# Patient Record
Sex: Male | Born: 1955 | Race: Black or African American | Hispanic: No | Marital: Single | Smoking: Current every day smoker
Health system: Southern US, Community
[De-identification: ages and names within clinical notes are randomized; demographics above are authoritative.]

## PROBLEM LIST (undated history)

## (undated) DIAGNOSIS — F1910 Other psychoactive substance abuse, uncomplicated: Secondary | ICD-10-CM

## (undated) DIAGNOSIS — M5430 Sciatica, unspecified side: Secondary | ICD-10-CM

## (undated) DIAGNOSIS — K08109 Complete loss of teeth, unspecified cause, unspecified class: Secondary | ICD-10-CM

## (undated) DIAGNOSIS — M1990 Unspecified osteoarthritis, unspecified site: Secondary | ICD-10-CM

## (undated) DIAGNOSIS — H509 Unspecified strabismus: Secondary | ICD-10-CM

## (undated) DIAGNOSIS — Z860100 Personal history of colon polyps, unspecified: Secondary | ICD-10-CM

## (undated) DIAGNOSIS — D573 Sickle-cell trait: Secondary | ICD-10-CM

## (undated) DIAGNOSIS — M549 Dorsalgia, unspecified: Secondary | ICD-10-CM

## (undated) DIAGNOSIS — K4020 Bilateral inguinal hernia, without obstruction or gangrene, not specified as recurrent: Secondary | ICD-10-CM

## (undated) DIAGNOSIS — F329 Major depressive disorder, single episode, unspecified: Secondary | ICD-10-CM

## (undated) DIAGNOSIS — G8929 Other chronic pain: Secondary | ICD-10-CM

## (undated) DIAGNOSIS — F32A Depression, unspecified: Secondary | ICD-10-CM

## (undated) DIAGNOSIS — Z972 Presence of dental prosthetic device (complete) (partial): Secondary | ICD-10-CM

## (undated) DIAGNOSIS — Z87828 Personal history of other (healed) physical injury and trauma: Secondary | ICD-10-CM

## (undated) DIAGNOSIS — S0990XA Unspecified injury of head, initial encounter: Secondary | ICD-10-CM

## (undated) HISTORY — DX: Other psychoactive substance abuse, uncomplicated: F19.10

## (undated) HISTORY — DX: Major depressive disorder, single episode, unspecified: F32.9

## (undated) HISTORY — PX: OTHER SURGICAL HISTORY: SHX169

## (undated) HISTORY — DX: Depression, unspecified: F32.A

## (undated) HISTORY — DX: Unspecified injury of head, initial encounter: S09.90XA

## (undated) HISTORY — DX: Dorsalgia, unspecified: M54.9

## (undated) HISTORY — DX: Sickle-cell trait: D57.3

## (undated) HISTORY — DX: Other chronic pain: G89.29

## (undated) HISTORY — DX: Unspecified osteoarthritis, unspecified site: M19.90

---
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
## 2001-07-06 ENCOUNTER — Emergency Department (HOSPITAL_COMMUNITY): Admission: EM | Admit: 2001-07-06 | Discharge: 2001-07-06 | Payer: Self-pay | Admitting: Emergency Medicine

## 2003-07-05 ENCOUNTER — Emergency Department (HOSPITAL_COMMUNITY): Admission: EM | Admit: 2003-07-05 | Discharge: 2003-07-06 | Payer: Self-pay | Admitting: Emergency Medicine

## 2004-02-10 ENCOUNTER — Emergency Department (HOSPITAL_COMMUNITY): Admission: EM | Admit: 2004-02-10 | Discharge: 2004-02-10 | Payer: Self-pay | Admitting: Emergency Medicine

## 2005-01-03 ENCOUNTER — Emergency Department (HOSPITAL_COMMUNITY): Admission: EM | Admit: 2005-01-03 | Discharge: 2005-01-03 | Payer: Self-pay | Admitting: Emergency Medicine

## 2005-01-06 ENCOUNTER — Ambulatory Visit: Payer: Self-pay | Admitting: *Deleted

## 2005-01-06 ENCOUNTER — Ambulatory Visit: Payer: Self-pay | Admitting: Family Medicine

## 2005-01-26 ENCOUNTER — Emergency Department (HOSPITAL_COMMUNITY): Admission: EM | Admit: 2005-01-26 | Discharge: 2005-01-26 | Payer: Self-pay | Admitting: Emergency Medicine

## 2006-09-08 ENCOUNTER — Inpatient Hospital Stay (HOSPITAL_COMMUNITY): Admission: EM | Admit: 2006-09-08 | Discharge: 2006-09-20 | Payer: Self-pay | Admitting: Emergency Medicine

## 2006-10-02 ENCOUNTER — Ambulatory Visit: Payer: Self-pay | Admitting: Internal Medicine

## 2007-05-16 ENCOUNTER — Ambulatory Visit: Payer: Self-pay | Admitting: Internal Medicine

## 2007-06-11 ENCOUNTER — Ambulatory Visit: Payer: Self-pay | Admitting: Internal Medicine

## 2007-07-24 ENCOUNTER — Ambulatory Visit: Payer: Self-pay | Admitting: Internal Medicine

## 2007-07-24 LAB — CONVERTED CEMR LAB
Albumin: 4.8 g/dL (ref 3.5–5.2)
CO2: 26 meq/L (ref 19–32)
Calcium: 9.9 mg/dL (ref 8.4–10.5)
Chloride: 102 meq/L (ref 96–112)
Cholesterol: 141 mg/dL (ref 0–200)
Eosinophils Relative: 3 % (ref 0–5)
Glucose, Bld: 80 mg/dL (ref 70–99)
HCT: 40.9 % (ref 39.0–52.0)
Lymphocytes Relative: 33 % (ref 12–46)
Lymphs Abs: 2.8 10*3/uL (ref 0.7–4.0)
Neutro Abs: 4.6 10*3/uL (ref 1.7–7.7)
Neutrophils Relative %: 54 % (ref 43–77)
Platelets: 203 10*3/uL (ref 150–400)
Potassium: 4.1 meq/L (ref 3.5–5.3)
Sodium: 141 meq/L (ref 135–145)
Total Protein: 7.6 g/dL (ref 6.0–8.3)
Triglycerides: 109 mg/dL (ref <150)
WBC: 8.5 10*3/uL (ref 4.0–10.5)

## 2007-09-17 ENCOUNTER — Ambulatory Visit: Payer: Self-pay | Admitting: Internal Medicine

## 2008-01-08 ENCOUNTER — Ambulatory Visit: Payer: Self-pay | Admitting: Internal Medicine

## 2008-03-03 ENCOUNTER — Encounter: Admission: RE | Admit: 2008-03-03 | Discharge: 2008-04-01 | Payer: Self-pay | Admitting: Orthopedic Surgery

## 2008-04-02 ENCOUNTER — Ambulatory Visit: Payer: Self-pay | Admitting: Internal Medicine

## 2008-04-05 ENCOUNTER — Ambulatory Visit (HOSPITAL_COMMUNITY): Admission: RE | Admit: 2008-04-05 | Discharge: 2008-04-05 | Payer: Self-pay | Admitting: Internal Medicine

## 2008-04-16 ENCOUNTER — Encounter: Admission: RE | Admit: 2008-04-16 | Discharge: 2008-04-16 | Payer: Self-pay | Admitting: Orthopedic Surgery

## 2008-05-01 HISTORY — PX: PATELLA FRACTURE SURGERY: SHX735

## 2008-05-01 HISTORY — PX: HIP FRACTURE SURGERY: SHX118

## 2008-05-27 ENCOUNTER — Ambulatory Visit: Payer: Self-pay | Admitting: Internal Medicine

## 2008-06-10 ENCOUNTER — Encounter: Admission: RE | Admit: 2008-06-10 | Discharge: 2008-06-10 | Payer: Self-pay | Admitting: Orthopaedic Surgery

## 2008-07-15 ENCOUNTER — Ambulatory Visit: Payer: Self-pay | Admitting: Internal Medicine

## 2008-07-15 LAB — CONVERTED CEMR LAB
Albumin: 4.9 g/dL (ref 3.5–5.2)
Alkaline Phosphatase: 90 units/L (ref 39–117)
Anti Nuclear Antibody(ANA): NEGATIVE
BUN: 12 mg/dL (ref 6–23)
Basophils Absolute: 0 10*3/uL (ref 0.0–0.1)
Basophils Relative: 0 % (ref 0–1)
CO2: 24 meq/L (ref 19–32)
Eosinophils Relative: 1 % (ref 0–5)
Glucose, Bld: 93 mg/dL (ref 70–99)
HCT: 43 % (ref 39.0–52.0)
Hemoglobin: 15.8 g/dL (ref 13.0–17.0)
Lymphocytes Relative: 21 % (ref 12–46)
MCHC: 36.7 g/dL — ABNORMAL HIGH (ref 30.0–36.0)
Monocytes Absolute: 0.8 10*3/uL (ref 0.1–1.0)
Monocytes Relative: 7 % (ref 3–12)
PSA: 0.27 ng/mL (ref 0.10–4.00)
Potassium: 4.2 meq/L (ref 3.5–5.3)
RBC: 4.88 M/uL (ref 4.22–5.81)
RDW: 15 % (ref 11.5–15.5)
Total Protein: 7.5 g/dL (ref 6.0–8.3)

## 2009-02-25 ENCOUNTER — Ambulatory Visit: Payer: Self-pay | Admitting: Internal Medicine

## 2009-04-02 ENCOUNTER — Encounter: Admission: RE | Admit: 2009-04-02 | Discharge: 2009-04-02 | Payer: Self-pay | Admitting: Neurological Surgery

## 2009-05-03 ENCOUNTER — Ambulatory Visit: Payer: Self-pay | Admitting: Internal Medicine

## 2009-05-03 LAB — CONVERTED CEMR LAB: PSA: 0.32 ng/mL (ref 0.10–4.00)

## 2009-07-28 ENCOUNTER — Emergency Department (HOSPITAL_COMMUNITY): Admission: EM | Admit: 2009-07-28 | Discharge: 2009-07-28 | Payer: Self-pay | Admitting: Emergency Medicine

## 2009-08-11 ENCOUNTER — Emergency Department (HOSPITAL_COMMUNITY): Admission: EM | Admit: 2009-08-11 | Discharge: 2009-08-11 | Payer: Self-pay | Admitting: Emergency Medicine

## 2010-09-13 NOTE — H&P (Signed)
NAME:  ESTILL, LLERENA NO.:  [id number]   MEDICAL RECORD NO.:  [id number]          PATIENT TYPE:  EMS   LOCATION:  MAJO                         FACILITY:  [facility]   PHYSICIAN:  Sandria Bales. Ezzard Standing, M.D.  DATE OF BIRTH:  March 19, 1956   DATE OF ADMISSION:  09/07/2006  DATE OF DISCHARGE:                              HISTORY & PHYSICAL   HISTORY:  Mr. Coppolino is a 55 year old black male who currently goes  to [facility] for his primary medical care.  His history is obtained  almost entirely from his family; his brother, sister and mother who are  at bedside.  The patient himself really is almost noncommunicative.   According to his presentation, he had climbed a ladder about 20 feet and  fell; while, we think, cutting branches.  There is a question whether he  had some type of drug abuse, it seems at which he first admitted too --  but now really is noncommunicative.   PAST MEDICAL HISTORY:   ALLERGIES:  NONE.   MEDICATIONS:  None.   REVIEW OF SYSTEMS:  NEUROLOGIC:  No partial seizure or loss of  consciousness.  PULMONARY:  He smokes cigarettes, but no heart disease or chest pain.  GASTROINTESTINAL:  No history of liver disease.  UROLOGIC:  No kidney stones or kidney infections.   Again, he is apparently seen through [facility] from his primary care  standpoint.  Again, he is on now medicines and has no chronic medical  issues.  But, he lives by himself and his family is really not aware of  his background.  It sounds like he may be part time homeless.   PHYSICAL EXAMINATION:  VITAL SIGNS:  Blood pressure 142/89, pulse 98,  respirations 20.  HEENT:  Shows multiple small scalp lacerations along his left anterior  scalp, above his left eye.  He has ecchymosis of his left eye.  His  extraocular movement is X6, he has a little bit of disconjugate gaze;  but, he actually seems to track or follow a finger pretty well.  His  pupils are about 3-4 mm, minimally  reactive but symmetric.   His dental dentition is terrible.  He has missing teeth and has caries  involving the remaining teeth, which all appear to be more chronic.  I  do not see any obvious acute oral injury.  NECK:  In a collar, but he has been moving and twisting his neck all  over the place without any obvious pain. We will leave him in the collar  until we can clear his neck.  LUNGS:  Clear to auscultation with symmetric breath sounds.  HEART:  Regular rate and rhythm without murmur or rub.  He has palpable  femoral pulses.  ABDOMEN:  Soft.  He has no tenderness, no guarding, no mass.  EXTREMITIES:  He has his left  hip flexed and internally rotated --  raising a question whether he has a left hip dislocation.  He has  pulses, both dorsalis pedis and posterior tibialis pulses in both feet -  - they appear to be symmetric.  He does react to pain  in his foot; also  he has sensation and motor function.  He is moving all extremities  grossly, though not really appropriately.  He has a 1.5 cm lac over his  right knee I will clean and close.   LABORATORY STUDIES:  Hemoglobin 14, hematocrit 42, white blood count  13,700, platelets 252.  Sodium 144, potassium 3.8, chloride 106, CO2 30,  glucose 103, BUN 18, creatinine 1.2.   I reviewed his films with Dr. Loraine Leriche Bulls.  First, a CT of the head shows  no obvious intracranial mass effect.  He does have a left zygomatic  fracture, a fracture to his  left orbit that extends  to the base of his  skull, with a little bit of air in his skull.   We do not have C-spine films.  He was not cooperative.  We tried 3  separate times to get him to do a CAT scan, without him cooperating  enough.   His chest x-ray shows a thickening of his left pleural space, but no  obvious pneumothorax or effusion.   His pelvis films appear to show a left acetabular fracture, though not  truly dislocated (at least on the lateral films).   IMPRESSION:  1. Closed  head injury from traumatic fall.  Plan observation.  2. Left zygomatic orbit and base of skull fracture.  The patient is      certainly at risk and is being admitted.  Will leave on antibiotics      for 48 hours.  But the fractures are really not much displaced, and      do not see any reason in getting maxillofacial involved.  3. Very poor dentition.  4. C-spine film pending at time of this dictation.  Due to movement.      We will try to repeat this in the morning.  Will leave in collar.  5. Left pleural thickening.  Etiology unclear.  6. Probable Left acetabular fracture with possible dislocation.  I      spoke with Dr. Alvy Beal, who will see the patient in      consultation.  7. A questionable drug abuse.  Will give vitamins and thiamine, and      see how he does from a sedation standpoint.      Sandria Bales. Ezzard Standing, M.D.  Electronically Signed     DHN/MEDQ  D:  09/07/2006  T:  09/07/2006  Job:  [id number]

## 2010-09-13 NOTE — Consult Note (Signed)
NAME:  Shawn Newton, Shawn Newton NO.:  [id number]   MEDICAL RECORD NO.:  [id number]          PATIENT TYPE:  INP   LOCATION:  3104                         FACILITY:  [facility]   PHYSICIAN:  Alvy Beal, MD    DATE OF BIRTH:  [birth year]   DATE OF CONSULTATION:  09/08/2006  DATE OF DISCHARGE:                                 CONSULTATION   REASON FOR CONSULTATION:  Status post fall, with question new versus old  left acetabular fracture.   HISTORY:  I was contacted by Dr. Ezzard Standing on Sep 07, 2006 at approximately  10:30, concerning this patient.  He was a 54 year old gentleman with a  history of substance abuse, according to his family who presented after  a fall.  He fell from a ladder and was diagnosed with a left frontal  skull fracture, which radiated into the base of the skull, left orbit  roof and lateral wall fracture, left zygomatic fracture, left mandible  fracture.  Right mandibular condyle fracture.  He also tested positive  for cocaine and benzodiazepines, and he had a left patellar fracture,  questionable versus new.  The patient was somewhat combative and the x-  rays of his hip were very poor, but there was some evidence of a  congenital or chronic deformity with a possible new acute fracture  component to it.  At that point in time, attempts at obtaining a CT scan  were made but the patient was just too combative and would not sit still  for the CT scan.  I was contacted concerning his exam and I indicated  that since he was neurologically intact, and there was no evidence of a  dislocation, that he could be admitted to the trauma service and we  could get the pelvic/hip CT in the morning.  At this point in time,  attempts were made to get the CT scan today, but he was still somewhat  combative, but he had recently been sedated and so he is in the process  of going for his CT scan.   PHYSICAL EXAMINATION:  NEUROLOGIC: Currently, the patient is sedated.  He is  in the neuro-intensive care unit.  He is hemodynamically intact.  He is grossly neurologically intact, moving all 4 extremities and  withdrawing to pain.  He has intact  peripheral pulses throughout.  The  left lower extremity is slightly internally rotated.  But it is not  short, relative to the contralateral extremity.  He has 2 staples over  the right knee, which were removed.  There was a small superficial  laceration noted.  I did probe it at the bedside.  It did not  communicate down to the tib bone.  There was no excessive fracture  bleeding noted.  It did not appear to communicate beyond the superficial  fascia.   At this point in time, because the patient is sedated, I could not get  an accurate neurologic exam.  His x-rays of the knee, which were ordered  today because of his complaints of knee demonstrated a minimally  displaced patella fracture of the right side.  He  is able to grossly  flex the knee and extend the knee and extend the knee and straight-leg  raise the extremity.  However, because of the patellar fracture.  I did  place him on a knee immobilizer.   At this point in time, I am somewhat concerned about his left acetabulum  and so since he is sedated, I have contacted CT and they will take him  now for his CT scan of the pelvis and left hip.  Furthermore, in terms  of the right patellar, given his history of substance abuse and his  questionable ability to be compliant with treatment options, I am going  to convert from the knee immobilizer to a long-leg cast on the right  side.  Keeping the cast in full extension will prevent further  displacement of the patellar fracture.   I am also going to contact my partner, Dr. Ranell Patrick to evaluate the x-rays  to determine whether or not he feels as though surgical fixation is  warranted.  If he does, then once the patient is cleared from a trauma  standpoint, will plan on performing an open reduction internal fixation  of  the right patella.  However, at this point in time, given his  significant skull fractures and trauma, and the positive cocaine, I do  not think at this point in time it is prudent to take him emergently to  the OR.   Will continue to monitor him and further recommendation pending the  review of the CT scan of his left pelvis.      Alvy Beal, MD  Electronically Signed     DDB/MEDQ  D:  09/08/2006  T:  09/08/2006  Job:  [id number]

## 2010-09-13 NOTE — Op Note (Signed)
NAME:  Shawn Newton, Shawn Newton NO.:  [id number]   MEDICAL RECORD NO.:  [id number]          PATIENT TYPE:  INP   LOCATION:  3104                         FACILITY:  [facility]   PHYSICIAN:  Alvy Beal, MD    DATE OF BIRTH:  12-22-1955   DATE OF PROCEDURE:  DATE OF DISCHARGE:                               OPERATIVE REPORT   PREOPERATIVE DIAGNOSES:  1. Right patella fracture, question open.  2. Left fracture dislocation of the hip acetabular fracture posterior-      superior wall with hip dislocation.   POSTOPERATIVE DIAGNOSES:  1. Left hip dislocation with acetabular fracture.  2. Open right patella fracture.   OPERATIVE PROCEDURE:  1. Closed reduction of left hip dislocation and insertion of proximal      tibial traction pin.  2. Irrigation and debridement of right patella fracture.  3. Open reduction and internal fixation of right patella fracture.   FIRST ASSISTANT:  Almedia Balls. Ranell Patrick, M.D.   HISTORY:  This is a very pleasant 55 year old gentleman who presented to  the trauma service on Sep 07, 2006 status post a fall from a ladder.  He  was initially diagnosed with a left hip deformity, question acuity.  A  CT scan done this evening demonstrated it was an acute acetabular  fracture with a dislocation.  At this point in time because of the  dislocation, we elected to take him to the operating room for closed  reduction and insertion of a traction pin.  In addition, because of the  questionable wound, although it was initially felt to be closed, we also  consented him for an I&D and injection to confirm that it was in fact  closed.  All appropriate risks, benefits and alternatives were explained  to the patient's mother as he could not sign consent.  Consent was  obtained and the patient was taken to the operating room.   INTRAOPERATIVE FINDINGS:  Open right patella fracture.  This was  confirmed with methylene blue injection into the joint.  That was  irrigated  and debrided and reduced and an open reduction using a  cannulated Synthes 45 screws with a tension time.   COMPLICATIONS:  None.   TOURNIQUET TIME:  44 minutes   On the left side, closed reduction was confirmed with intraoperative  fluoro views.  Proximal tibial traction pin placement was confirmed with  C arm and 10 pounds of traction was applied.   OPERATIVE NOTE:  The patient was brought into the operating room and  placed supine on the operating room table.  After successful induction  of general anesthesia and endotracheal intubation, both lower  extremities were prepped and draped in a standard fashion.  Dr. Ranell Patrick  and I then performed a gentle closed reduction maneuver and restored the  length and rotation back to the lower extremity.  We confirmed reduction  of the hip with AP and lateral views on the fluoro, which were saved.  We then placed a proximal tibial traction pin starting on the lateral  aspect through an incision proceeding through the proximal tibia and  then exiting through  a separate stab incision.  We confirmed position of  the traction pin in the AP and lateral planes with fluoro.  We then  applied 10 pounds of traction in order to maintain the reduction of the  hip dislocation.   At this point in time, I then injected about 20 mL of methylene blue  into the right knee.  The methylene blue did escape through the wound  and at this point, we confirmed there was truly an open patella  fracture.   At that point, we then reprepped and draped the right lower extremity  and then proceeded with ORIF.   A tourniquet was applied.  The leg was elevated and exsanguinated with  an Esmarch and the tourniquet was inflated to 300 mmHg.  At this point  in time, a midline incision was made over the patella and we excised the  small superficial laceration.  We then removed the portion of the bursa  as well and debrided the area.  At this point, we then incised the   fascia over the fracture site and exposed the fracture site itself.  We  then evacuated the hematoma, curetted the bony edges and then irrigated  the joint with 3 liters of lactated Ringer's with pulsatile lavage.  Once we cleaned out the wound, I then checked the posterior compartments  of the calf to ensure there was no extravasation of fluid and no undue  pressure.  The compartments were noted to be soft.   At this point in time, we then proceeded with the ORIF.  A guide pin was  then advanced through the inferior aspect of the patella across the  fracture site and into the superior portion of the patella.  A second  guide pin was advanced slightly perpendicular to the fracture and just a  little bit convergent to the first pin.  We then confirmed that the pins  were not intraarticular.  Once confirmed we then measured and then  drilled with the 2.7 drill.  We then advanced two cannulated screws over  the guide pain at all times maintaining the reduction.  We had excellent  purchase of the screws and we were able to maintain the reduction.  We  then repeated the AP and lateral films to confirm that the screws were  not intraarticular and that we had satisfactory reduction.  Once we  confirmed that the reduction was anatomic and the screws were in proper  position in both the AP and lateral planes, we then passed a wire from  inferior to superior through the cannulated screw and then wrapped it  over the patella and then back through the other screw.  At this point,  we then criss-crossed them over the patella and then under appropriate  torque, tension applied with the tension band.  We then buried the wire  in the quadriceps tendon and cut the edges.  We were then able to gently  flex the knee and there was no gapping of the patella fracture.  We then  took final x-rays which were satisfactory in both the AP and lateral planes.  We then irrigated the wound copiously with normal  saline  pulsatile lavage and then closed the superficial tissue with 2-0 Vicryl  sutures, the skin with staples, a dry bulky dressing was applied and an  Ace wrap.  The tourniquet was deflated and a knee immobilizer was  applied.  The patient was then transferred onto a traction bed where the  10 pounds was reapplied and then he was transferred to the PACU without  incident.  At the end of the case, all needle and sponge counts were  correct.      Alvy Beal, MD  Electronically Signed     DDB/MEDQ  D:  09/08/2006  T:  09/09/2006  Job:  [id number]

## 2010-09-13 NOTE — Consult Note (Signed)
NAME:  Shawn Newton, Shawn Newton NO.:  [id number]   MEDICAL RECORD NO.:  [id number]          PATIENT TYPE:  INP   LOCATION:  3104                         FACILITY:  [facility]   PHYSICIAN:  Doralee Albino. Carola Frost, M.D. DATE OF BIRTH:  02/15/1956   DATE OF CONSULTATION:  09/10/2006  DATE OF DISCHARGE:                                 CONSULTATION   REASON FOR CONSULTATION:  Left acetabular fracture, dislocation status  post reduction.   BRIEF HISTORY PRESENTATION:  The patient is a 56 year old male,  polysubstance abuser, who fell approximately 20 feet sustaining injury  to his left hip and right knee.  He was seen and evaluated in the  emergency department by the trauma service, who noted some internal  rotation of his left hip.  He was combative, unable to cooperate with  the examination.  He was subsequently seen and evaluated by Dr. Shon Baton,  who then was then was successful in being able to obtain a CT scan of  the pelvis.  He had previous attempts times 3 at a CT scan but because  of his combativeness was unable really to undergo the scan, per report.  He also was noted to have a small laceration of the right knee, which  apparently was washed out and closed in the ED.  CT scan did demonstrate  a fracture dislocation of the left hip.  The patient was then taken to  the operating room where he underwent closed reduction and placement of  a traction pin as well as I and D and fixation of the right patella  fracture after it was found to be open by intra-articular injection.   I was subsequently asked to see the patient regarding definitive  management of his left acetabular fracture.  The interval to relocation  was approximately 24 hours from injury.   PHYSICAL EXAMINATION:  GENERAL:  On examination today the patient  remains very difficult to examine.  He is intermittently answering  questions, but does not seem to be coherent.  He is demonstrating a  spontaneous motion of all  4 extremities and is capable of some limited  cooperation with the examination.   PAST MEDICAL HISTORY:  Prior jaw fractures.  Negative for coronary  diabetes, hypertension or COPD.   SOCIAL HISTORY:  The patient does smoke tobacco.  Has a history of  marijuana use.   ALLERGIES:  NONE.   PRIMARY CARE PHYSICIAN:  [facility].   PHYSICAL EXAMINATION:  PSYCHIATRIC:  The patient is in restraints.  EXTREMITIES:  His leg lengths appear equal.  His hip rotation is  symmetric.  He is unable to tell me whether he has intact sensation over  the foot, but he can demonstrate active deep peroneal, superficial  peroneal and tibial nerve motor function.  Posterior tib pulse 2+ and  dorsalis pedis pulses not readily palpable.  No focal crepitus or  tenderness about the ankle or left knee.  No focal tenderness or  crepitus about the left wrist, elbow and shoulder, similarly right  wrist, elbow, shoulder without focal crepitus, ecchymosis, blocked  motion or diminished strength.  Also of  note, the right knee is in a  knee immobilizer and dressing.  This was not removed.  GENERAL:  In general he appears moderately well-nourished and  appropriate for age.  HEENT:  He does have poor dentition.   IMAGING:  X-rays that demonstrate a large posterior wall fracture  dislocation on the left which is status post reduction now.  C-arm  images only are available following reduction.  Also visualized are the  AP and lateral of patellar films of both pre and post ORIF with  excellent reduction.   ASSESSMENT:  1. Left acetabular fracture, dislocation with prolonged interval to      reduction.  2. Polysubstance abuse.   </   ASSESSMENT/PLAN:  The patient's post reduction CT scan as well as plain  films, the need for definitive internal fixation given the large  fragment, clearly the patient is at increased risk of complications  including avascular necrosis, arthritis, heterotopic ossification, in   addition to deep venous thrombosis , pulmonary embolism, and others.  I  will discuss these findings with trauma service and be happy to assume  care of the acetabular injury.      Doralee Albino. Carola Frost, M.D.  Electronically Signed     MHH/MEDQ  D:  09/10/2006  T:  09/10/2006  Job:  [id number]

## 2010-09-13 NOTE — Op Note (Signed)
NAME:  Shawn Newton, Shawn Newton NO.:  [id number]   MEDICAL RECORD NO.:  [id number]          PATIENT TYPE:  INP   LOCATION:  5020                         FACILITY:  [facility]   PHYSICIAN:  Doralee Albino. Carola Frost, M.D. DATE OF BIRTH:  1956-02-26   DATE OF PROCEDURE:  09/11/2006  DATE OF DISCHARGE:  09/20/2006                               OPERATIVE REPORT   PREOPERATIVE DIAGNOSES:  1. Left acetabular posterior wall fracture status post hip reduction.  2. Retained tibial traction pin.   POSTOPERATIVE DIAGNOSES:  1. Left acetabular posterior wall fracture status post hip reduction.  2. Retained tibial traction pin.   PROCEDURES:  1. ORIF of left posterior wall fracture.  2. Removal of a percutaneous implant (tibial pin).   SURGEON:  Doralee Albino. Carola Frost, M.D.   ASSISTANT:  None.   ANESTHESIA:  General.   SPECIMENS:  None.   ESTIMATED BLOOD LOSS:  400 mL.   COMPLICATIONS:  None.   DISPOSITION:  To PACU.   CONDITION:  Stable.   BRIEF SUMMARY OF INDICATIONS FOR PROCEDURE:  Shawn Newton is a 55-  year-old male initially seen and evaluated by Dr. Venita Lick for a  left acetabular fracture dislocation and open patellar fracture.  I was  subsequently consulted for assistance and definitive management with the  acetabular injury.  I discussed with the patient preoperatively the  risks and benefits of surgery including the possibility infection, nerve  injury, vessel injury, avascular necrosis, arthritis, decreased range of  motion, heterotopic ossification, a need for further surgery, DVT, PE,  heart attack, stroke and others.  After a full discussion, the patient  wished to proceed.   BRIEF DESCRIPTION OF PROCEDURE:  Shawn Newton was taken to the  operating room where general anesthesia was induced.  His left lower  extremity was prepped and draped in the usual sterile fashion.  We did  remove the traction pin after administration of general anesthesia.  A  standard  Kocher-Lagenbach approach was made through a 12-cm incision.  The short rotators were identified, tagged and released.  There was a  large posterior wall fragment.  The hip was reduced.  Traction was  applied through a pin placed into the proximal femur.  This did not  identify any intra-articular fragments within the joint itself.  It was  thoroughly irrigated and suctioned during distraction.  The posterior  wall fragment was mobilized after cleaning it thoroughly with curette  and lavage.  We were then able to obtain a nice reduction which was held  provisionally with K-wires, then a lag screw followed by buttress  plating.  We were able to obtain a nice reduction, good apposition, and  this was confirmed by multiple C-arm images.  We did have to correct  some marginal impaction by using autograft from the proximal femur,  elevating that surface with the osteotome.  Prior to placing the  posterior wall fragment, we also drilled several small holes at the edge  of the articular surface and these bled briskly indicating intact  vascularization of the femoral head.  The wound was copiously irrigated  and closed in standard  layered fashion using a #2 FiberWire for the  short rotators, figure-of-eight #1 Vicryl for the vastus, 0 for the deep  subcutaneous, 2-0 for the shallow subcutaneous, and staples for the  skin.  A sterile gently compressive dressing was applied and then a knee  immobilizer.  The patient was awakened from anesthesia and transported  to the PACU in stable condition.   PROGNOSIS:  Shawn Newton has sustained a severe injury and given the  circumstances is at increased risk for avascular necrosis and arthrosis.  Fortunately, he did not have any neurologic injury preoperatively and  none is anticipated postoperatively.  He will be on Coumadin for DVT  prophylaxis for the next 6 weeks.  He will have touchdown weightbearing  through 8 weeks with graduated weightbearing  thereafter.  Given his  polysubstance abuse, he is clearly at increased risk for complications  particularly as related to noncompliance such as loss of fixation,  dislocation and similar.      Doralee Albino. Carola Frost, M.D.  Electronically Signed     MHH/MEDQ  D:  11/08/2006  T:  11/09/2006  Job:  [id number]

## 2010-09-13 NOTE — Discharge Summary (Signed)
NAME:  Shawn Newton, Shawn Newton NO.:  [id number]   MEDICAL RECORD NO.:  [id number]          PATIENT TYPE:  INP   LOCATION:  5020                         FACILITY:  [facility]   PHYSICIAN:  Gabrielle Dare. Janee Morn, M.D.DATE OF BIRTH:  May 28, 1955   DATE OF ADMISSION:  09/07/2006  DATE OF DISCHARGE:  09/20/2006                               DISCHARGE SUMMARY   DISCHARGE DIAGNOSES:  1. Fall.  2. Right open patella fracture.  3. Left acetabular fracture with hip dislocation.  4. Polysubstance abuse.  5. Left sciatica.  6. Hyponatremia.  7. Left frontal skull fracture.  8. Concussion.  9. Acute blood loss anemia.   CONSULTANTS:  1. Dr. Shon Baton for orthopedic surgery.  2. Dr. Carola Frost for orthopedic surgery.  3. Dr. Pollyann Kennedy for ENT.   PROCEDURES:  1. Closure of scalp and knee lacerations.  2. ORIF (open reduction and internal fixation) of the right open      patellar fracture and closed reduction of left hip and placement of      a traction pin by Dr. Shon Baton.  3. ORIF (open reduction and internal fixation) the left acetabular      fracture by Dr. Carola Frost.   HISTORY OF PRESENT ILLNESS:  This is a 55 year old black male who was  working in a tree about 20 feet up when he fell.  He was seen and  evaluated in the emergency department as a  silver trauma alert.  He was  a little confused.  Workup demonstrated the above-mentioned fractures as  well as several old facial fractures.  Dr. Shon Baton and Dr. Pollyann Kennedy were  called to see the patient and he was admitted for pain control and  definitive repair of his fractures.  Dr. Pollyann Kennedy came in and saw the  patient for his facial fractures and determined that they were old and  so nothing needed to be done about them.  He had his knee fixed acutely  and a traction pin placed for his hip fracture.  This was then later  fixed by Dr. Carola Frost.  The patient then underwent rehabilitation.  He was  not very cooperative with weightbearing restrictions and was  somewhat  verbally abusive to the physical therapy staff.  Therefore, he was let  go by physical therapy.  Since he was effectively homeless, placement  was sought and obtained at an assisted living facility.  He did have  some problems with hyponatremia while he was here but this seemed to  self-correct.  He did note some left-sided sciatica symptoms which he  notes were present before the accident but seemed to have been  exacerbated by the accident.  Because of his lack of funding, MR and  surgery are probably not an option for him.  He had been taking anti-  inflammatories for the problem before, which I think is a good plan, and  he will continue with that.  He was transferred in good condition to  [facility] assisted living facility.   DISCHARGE MEDICATIONS:  1. Colace 100 mg orally b.i.d., #60, with no refills.  2. Coumadin 5 mg orally daily or as directed, #  15, with no refills.  3. Norco 5/325 take 1 or 2 every 4 hours p.r.n. pain, #80, with no      refill.  4. Ibuprofen 800 mg tablets, take 1 orally t.i.d. with food, #90, with      no refill.   FOLLOWUP INSTRUCTIONS:  The patient will follow up with Dr. Carola Frost for  his orthopedic injuries and with [facility] for his chronic sciatica.  He may call the trauma service with any questions or concerns.      Earney Hamburg, P.A.      Gabrielle Dare Janee Morn, M.D.  Electronically Signed    MJ/MEDQ  D:  09/20/2006  T:  09/20/2006  Job:  [id number]   cc:   Doralee Albino. Carola Frost, M.D.  [facility]

## 2010-09-13 NOTE — Consult Note (Signed)
NAME:  Shawn Newton, Shawn Newton NO.:  [id number]   MEDICAL RECORD NO.:  [id number]          PATIENT TYPE:  INP   LOCATION:  1823                         FACILITY:  [facility]   PHYSICIAN:  Jefry H. Pollyann Kennedy, MD     DATE OF BIRTH:  [birth year]   DATE OF CONSULTATION:  09/08/2006  DATE OF DISCHARGE:                                 CONSULTATION   TIME:  8:00 a.m.   SITE:  [facility].   HISTORY:  This is a 55 year old who has a history of significant cocaine  abuse.  Larey Seat off a ladder sometime either last night or this morning and  has been evaluated in the emergency department by the trauma service.  He was found on facial CT scanning to have a mandible fracture, left  body right subcondylar region.  There were no other facial fractures  identified on imaging.  He had lacerations to the scalp that were  stapled.  He is currently having his cervical spine evaluated.  He has  an extremity fracture that is going to be managed by orthopedics.   PAST MEDICAL AND SURGICAL HISTORY:  Otherwise unknown.   PHYSICAL EXAMINATION:  GENERAL:  He is a thin man in no distress.  He is  quite combative and is currently being sedated with Ativan.  HEENT:  His oral cavity and pharynx reveals very poor remaining  dentition.  No acute mucosal injury.  No blood or swelling in the oral  cavity.  The mandible is stable by palpation.  There is some ecchymosis  around the left cheek and forehead scalp area and staples in the  parietal scalp.  There is no palpable bony step off in the zygomatic  process or the orbital rims.  Nasal and eye exams were unremarkable.   The CT was reviewed.  There is a mildly displaced left body fracture of  the mandible and a nondisplaced fracture of the subcondylar region on  the right.  Given the lack of physical findings, I went back and  reviewed his old films.  There was a CT of the face from 2006 and the  fractures were present at that  time as well and appeared to be  identical.  Presumably they were acute at that time, although that is  not clear to me since I do not have an old chart available.   IMPRESSION:  Old mandibular fracture, no acute injury.  No intervention  necessary at this time.  I will follow up as needed.      Jefry H. Pollyann Kennedy, MD  Electronically Signed     JHR/MEDQ  D:  09/08/2006  T:  09/09/2006  Job:  [id number]

## 2010-11-07 ENCOUNTER — Encounter (HOSPITAL_COMMUNITY)
Admission: RE | Admit: 2010-11-07 | Discharge: 2010-11-07 | Disposition: A | Discharge status: Home / Self Care | Payer: Medicare Other | Source: Ambulatory Visit | Attending: Orthopaedic Surgery | Admitting: Orthopaedic Surgery

## 2010-11-07 LAB — CBC
HCT: 39.5 % (ref 39.0–52.0)
MCHC: 37.5 g/dL — ABNORMAL HIGH (ref 30.0–36.0)
Platelets: 218 10*3/uL (ref 150–400)
RDW: 13.8 % (ref 11.5–15.5)
WBC: 9.5 10*3/uL (ref 4.0–10.5)

## 2010-11-07 LAB — BASIC METABOLIC PANEL
BUN: 8 mg/dL (ref 6–23)
Calcium: 9.3 mg/dL (ref 8.4–10.5)
Chloride: 107 mEq/L (ref 96–112)
Creatinine, Ser: 0.7 mg/dL (ref 0.50–1.35)
GFR calc Af Amer: >60 mL/min (ref >60)
GFR calc non Af Amer: >60 mL/min (ref >60)

## 2010-11-07 LAB — HEPATIC FUNCTION PANEL
ALT: 22 U/L (ref 0–53)
AST: 15 U/L (ref 0–37)
Alkaline Phosphatase: 99 U/L (ref 39–117)
Bilirubin, Direct: 0.1 mg/dL (ref 0.0–0.3)
Indirect Bilirubin: 0.2 mg/dL — ABNORMAL LOW (ref 0.3–0.9)
Total Bilirubin: 0.3 mg/dL (ref 0.3–1.2)

## 2010-11-07 LAB — SURGICAL PCR SCREEN
MRSA, PCR: NEGATIVE
Staphylococcus aureus: NEGATIVE

## 2010-11-09 ENCOUNTER — Other Ambulatory Visit (HOSPITAL_COMMUNITY): Payer: Self-pay | Admitting: Orthopaedic Surgery

## 2010-11-09 ENCOUNTER — Other Ambulatory Visit: Payer: Self-pay | Admitting: Orthopaedic Surgery

## 2010-11-09 ENCOUNTER — Ambulatory Visit (HOSPITAL_COMMUNITY)
Admission: RE | Admit: 2010-11-09 | Discharge: 2010-11-09 | Disposition: A | Discharge status: Home / Self Care | Payer: Medicare Other | Source: Ambulatory Visit | Attending: Orthopaedic Surgery | Admitting: Orthopaedic Surgery

## 2010-11-09 DIAGNOSIS — Z01818 Encounter for other preprocedural examination: Secondary | ICD-10-CM | POA: Insufficient documentation

## 2010-11-09 DIAGNOSIS — D1739 Benign lipomatous neoplasm of skin and subcutaneous tissue of other sites: Secondary | ICD-10-CM | POA: Insufficient documentation

## 2010-11-09 DIAGNOSIS — I498 Other specified cardiac arrhythmias: Secondary | ICD-10-CM

## 2010-11-09 DIAGNOSIS — F17200 Nicotine dependence, unspecified, uncomplicated: Secondary | ICD-10-CM | POA: Insufficient documentation

## 2010-11-09 DIAGNOSIS — Z01811 Encounter for preprocedural respiratory examination: Secondary | ICD-10-CM

## 2010-11-09 DIAGNOSIS — Z01810 Encounter for preprocedural cardiovascular examination: Secondary | ICD-10-CM | POA: Insufficient documentation

## 2010-11-09 LAB — PROTIME-INR: Prothrombin Time: 13.3 seconds (ref 11.6–15.2)

## 2010-11-21 NOTE — Op Note (Signed)
  NAME:  Shawn Newton, Shawn Newton NO.:  [id number]  MEDICAL RECORD NO.:  [id number]  LOCATION:  XRAY                         FACILITY:  [facility]  PHYSICIAN:  Araly Kaas C. Ophelia Charter, M.D.    DATE OF BIRTH:  09-15-1955  DATE OF PROCEDURE:  11/09/2010 DATE OF DISCHARGE:  11/09/2010                              OPERATIVE REPORT   PREOPERATIVE DIAGNOSIS:  Large subcutaneous lumbar lipoma.  POSTOPERATIVE DIAGNOSIS:  Large subcutaneous lumbar lipoma.  PROCEDURE:  Excision of right lumbar subcutaneous lipoma.  SURGEON:  Annell Greening, MD  ASSISTANT:  Maud Deed, PA-C  ANESTHESIA:  GOT plus Marcaine skin local.  After induction of general anesthesia, the patient placed in lateral position on bean bag, lateral axillary roll, careful padding, positioning, pillows between the knees, lateral malleolus.  The patient's large lipoma was measured 12 x 10 x 18 cm.  He states he had gradual enlargement over large number of years.  With induction of general anesthesia, the patient had some bradycardia down into the 40s which was corrected with inotropes to bring his heart rate up and he responded well to this.  The patient had normal EKG.  No evidence of underlying cardiac disease, but this will need to be evaluated postoperatively by Cardiology.  The area over the flank was prepped with DuraPrep.  Area was squared with towels.  The lesion was not quite as large as a football, but had bothered him particularly with sitting or lying on this side.  Incision was made 8 cm.  Subcutaneous tissue was dissected and the lipoma was multilobulated and encapsulated.  Starting anteriorly and cephalad continued following round using Bovie electrocautery intermittently, ringed scissors, sharp dissection when necessary, coagulation of feeder vessels.  The lesion was follow around the capsuled, and as pieces were mobilized and portion was teased out, a sponge placed over the top for traction..  Continued  dissection was performed around the lesion until it was completely excised.  Its size measurements were consistent with the MRI and preoperative scan showing a large lesion.  The lesion was removed for pathologic examination.  Multiple bulb syringes were used for irrigation and then standard layered closure with Vicryl sutures. Subcuticular Vicryl closure, tincture of Benzoin, Steri-Strips, Mepilex stressing, Marcaine infiltration in the skin.  The patient tolerated the procedure well and was transferred to recovery room where he will be evaluated by the Cardiology Service.  Lesion measured 15 x 10 x 5-6 cm, consistent with multilobulated lipoma.  This was placed in formalin and sent for pathologic examination.  The patient tolerated the procedure well.     Markie Frith C. Ophelia Charter, M.D.     MCY/MEDQ  D:  11/09/2010  T:  11/10/2010  Job:  [id number]  Electronically Signed by Annell Greening M.D. on 11/21/2010 04:09:12 PM

## 2010-12-27 NOTE — Consult Note (Signed)
NAME:  Shawn Newton, HANWAY NO.:  [id number]  MEDICAL RECORD NO.:  [id number]  LOCATION:                                 FACILITY:  PHYSICIAN:  Bevelyn Buckles. Deyra Perdomo, MDDATE OF BIRTH:  1956/02/10  DATE OF CONSULTATION:  11/09/2010 DATE OF DISCHARGE:                                CONSULTATION   REFERRING PHYSICIAN:  Mark C. Ophelia Charter, MD  PRIMARY CARDIOLOGIST:  New patient.   REASON FOR CONSULTATION:  Bradycardia.  HISTORY OF PRESENT ILLNESS:  This is a 55 year old African American gentleman without known coronary artery disease who presented to [facility] for elective lower back subcutaneous mass removal.  Once intubated, the patient was bradycardic with heart rates in the 30s.  The patient was given atropine with slow improvement but has returned to baseline of 40. The patient is currently asymptomatic.  He denied presyncopal symptoms or chest pain as well as no shortness of breath.  The patient states he was in his usual state of health prior to his operation.  He actually rides his bike all over town as he does not have a car.  He is able to do this without difficulty.  He denies syncope.  He denies dizziness during this time.  The patient is unsure if he has a slow heart rate at home.  He does state that he is extremely active, riding his bike everywhere without difficulty.  All other vital signs are stable besides the patient's heart rate.  EKG is without acute changes and all intervals are within normal limits.  Cardiology was asked to consult.  The patient was also asked to exercise with his arms and showed an appropriate increase in heart rate to the 50s.  PAST MEDICAL HISTORY: 1. History of cocaine abuse, the patient quit years ago. 2. Continued tobacco abuse, cigars daily. 3. Status post hip repair. 4. Status post knee repair.  SOCIAL HISTORY:  The patient lives at home.  He drinks approximately 3 cigars a day.  He does not like cigarettes.  He drinks  12-24 ounces of beer in 3 days or so.  He has a history of cocaine abuse but quit approximately 2 years ago.  He rides his bike daily.  He has no family history of premature coronary artery disease.  Relatives do have diabetes.  ALLERGIES:  No known drug allergies.  HOME MEDICATIONS:  Percocet.  REVIEW OS SYSTEMS:  All pertinent positives and negatives as stated in HPI.  Other systems have been reviewed and are negative.  PHYSICAL EXAMINATION:  VITAL SIGNS:  Pulse 37-40, respirations 16, blood pressure 102/50, and O2 saturation 99% on room air. GENERAL:  This is a well-developed, well-nourished, middle-aged gentleman. HEENT:  Normal except for left lazy eye noted. NECK:  Supple without JVD. HEART:  Bradycardic with S1 and S2.  No murmur, rub, or gallop noted. Pulses are 2+ and equal bilaterally. LUNGS:  Clear to auscultation anteriorly without wheezes, rales, or rhonchi. ABDOMEN:  Soft and nontender.  Positive bowel sounds x4. EXTREMITIES:  No clubbing, cyanosis, or edema. MUSCULOSKELETAL:  No joint deformities or effusions. NEUROLOGIC:  Alert and oriented x3.  Cranial nerves II through XII grossly intact.  EKG showing sinus bradycardia  at a rate of 45 beats per minute.  No ischemic changes.  There is diffuse precordial J-point elevation noted. No Q-waves.  No prior tracings for comparison.  ASSESSMENT AND PLAN:  This is a 55 year old gentleman who presents for an elective lower back subcutaneous mass removal who now presents with asymptomatic bradycardia in the postoperative setting.  We suspect the bradycardia is chronic.  The patient is completely asymptomatic.  He also is  very active at home, riding his bike around town without presyncope or syncopal episodes.  Review of his EKG is without acute changes and all intervals are within normal limits.  At this time, the patient is stable for discharge.  He can follow up with Cardiology in the outpatient setting as needed.  We  will check a TSH prior to discharge that can be followed up with his primary care provider.     [name], PA-C   ______________________________ Bevelyn Buckles. [name], MD    NB/MEDQ  D:  11/09/2010  T:  11/10/2010  Job:  [id number]  Electronically Signed by Alen Blew P.A. on 11/15/2010 11:21:36 AM Electronically Signed by Arvilla Meres MD on 12/27/2010 05:14:17 PM

## 2011-03-30 ENCOUNTER — Encounter: Payer: Self-pay | Admitting: Internal Medicine

## 2011-04-12 ENCOUNTER — Ambulatory Visit (AMBULATORY_SURGERY_CENTER): Payer: Medicare Other | Admitting: *Deleted

## 2011-04-12 VITALS — Ht 69.0 in | Wt 178.7 lb

## 2011-04-12 DIAGNOSIS — Z1211 Encounter for screening for malignant neoplasm of colon: Secondary | ICD-10-CM

## 2011-04-12 DIAGNOSIS — K921 Melena: Secondary | ICD-10-CM

## 2011-04-12 MED ORDER — PEG-KCL-NACL-NASULF-NA ASC-C 100 G PO SOLR: ORAL | Status: DC

## 2011-04-12 NOTE — Progress Notes (Signed)
Pt has history of cocaine use and admits to drinking 40 oz of beer every 3 days.  Colonoscopy changed to propofol colonoscopy. Shawn Newton

## 2011-04-21 ENCOUNTER — Other Ambulatory Visit: Payer: Medicare Other | Admitting: Internal Medicine

## 2011-05-24 ENCOUNTER — Telehealth: Payer: Self-pay | Admitting: Internal Medicine

## 2011-05-24 NOTE — Telephone Encounter (Signed)
movi prep directions reviewed with pt. He wrote them down as he left his copy on the bus this am.Will call back if any questions as he has no transportation to come pick a copy up.

## 2011-05-24 NOTE — Telephone Encounter (Signed)
Instructions reviewed with pt. He wrote them down because he has no transportation to come pick up a copy.He will call back if he has any questions.

## 2011-05-25 ENCOUNTER — Encounter: Payer: Self-pay | Admitting: Internal Medicine

## 2011-05-25 ENCOUNTER — Ambulatory Visit (AMBULATORY_SURGERY_CENTER): Payer: Medicare Other | Admitting: Internal Medicine

## 2011-05-25 ENCOUNTER — Encounter: Payer: Medicare Other | Admitting: Internal Medicine

## 2011-05-25 VITALS — BP 131/80 | HR 47 | Temp 98.1°F | Resp 20 | Ht 69.0 in | Wt 178.0 lb

## 2011-05-25 DIAGNOSIS — D126 Benign neoplasm of colon, unspecified: Secondary | ICD-10-CM

## 2011-05-25 DIAGNOSIS — Z1211 Encounter for screening for malignant neoplasm of colon: Secondary | ICD-10-CM

## 2011-05-25 HISTORY — PX: COLONOSCOPY: SHX174

## 2011-05-25 MED ORDER — SODIUM CHLORIDE 0.9 % IV SOLN: 500.0000 mL | INTRAVENOUS | Status: DC

## 2011-05-25 NOTE — Progress Notes (Signed)
No complaints noted in the recovery room. Maw  Pt toelrated po water well. Maw  Patient did not experience any of the following events: a burn prior to discharge; a fall within the facility; wrong site/side/patient/procedure/implant event; or a hospital transfer or hospital admission upon discharge from the facility. [phone]) Patient did not have preoperative order for IV antibiotic SSI prophylaxis. [phone])

## 2011-05-25 NOTE — Progress Notes (Signed)
Propofol administered by s camp crna. See scanned intra procedure report. ewm  Pt states sinus brady wnl for him. ewm

## 2011-05-25 NOTE — Op Note (Signed)
[facility] [address] [place], [place]  [address]  COLONOSCOPY PROCEDURE REPORT  PATIENT:  Shawn, Newton  MR#:  [id number] BIRTHDATE:  Jun 28, 1955, 55 yrs. old  GENDER:  male ENDOSCOPIST:  Iva Boop, MD, [facility] REF. BY:  Florentina Jenny, M.D. PROCEDURE DATE:  05/25/2011 PROCEDURE:  Colonoscopy with biopsy and snare polypectomy ASA CLASS:  Class III INDICATIONS:  Routine Risk Screening MEDICATIONS:   MAC sedation, administered by CRNA, propofol (Diprivan) 300 mg IV  DESCRIPTION OF PROCEDURE:   After the risks benefits and alternatives of the procedure were thoroughly explained, informed consent was obtained.  Digital rectal exam was performed and revealed no abnormalities and normal prostate.   The LB 180AL [id number] endoscope was introduced through the anus and advanced to the cecum, which was identified by both the appendix and ileocecal valve, without limitations.  The quality of the prep was good, using MoviPrep.  The instrument was then slowly withdrawn as the colon was fully examined. <<PROCEDUREIMAGES>>  FINDINGS:  Five polyps were found in the sigmoid colon. All diminutive. Cold snare and cold biopsy removal.  This was otherwise a normal examination of the colon.   Retroflexed views in the rectum revealed internal hemorrhoids.    The time to cecum = 5:24 minutes. The scope was then withdrawn in 14:01 minutes from the cecum and the procedure completed. COMPLICATIONS:  None ENDOSCOPIC IMPRESSION: 1) Five polyps in the sigmoid colon - all 5 mm or less and removed 2) Internal hemorrhoids 3) Otherwise normal examination, good prep  REPEAT EXAM:  In for Colonoscopy, pending biopsy results.  Iva Boop, MD, [facility]  CC:  Florentina Jenny, MD and The Patient  n. eSIGNED:   Iva Boop at 05/25/2011 02:12 PM  Beckie Busing, [phone]

## 2011-05-25 NOTE — Patient Instructions (Addendum)
5 small polyps were removed. They look benign (not cancer). Will send a letter about the biopsy results and recommendations for next routine colonoscopy. Iva Boop, MD, [facility]   See the picture page for your findings from your exam today.  Follow the green and blue discharge instruction sheets the rest of the day.  Resume your prior medications today. Please call if any questions or concerns.

## 2011-05-26 ENCOUNTER — Telehealth: Payer: Self-pay | Admitting: *Deleted

## 2011-05-26 NOTE — Telephone Encounter (Signed)

## 2011-05-30 ENCOUNTER — Encounter: Payer: Self-pay | Admitting: Internal Medicine

## 2011-05-30 NOTE — Progress Notes (Signed)
Quick Note:  Hyperplastic sigmoid polyps Repeat colonoscopy 10 years 2023 ______

## 2011-06-22 NOTE — Telephone Encounter (Signed)
completed

## 2012-06-12 ENCOUNTER — Emergency Department (HOSPITAL_COMMUNITY): Payer: Medicare Other

## 2012-06-12 ENCOUNTER — Emergency Department (HOSPITAL_COMMUNITY)
Admission: EM | Admit: 2012-06-12 | Discharge: 2012-06-12 | Disposition: A | Discharge status: Home / Self Care | Payer: Medicare Other | Attending: Emergency Medicine | Admitting: Emergency Medicine

## 2012-06-12 ENCOUNTER — Encounter (HOSPITAL_COMMUNITY): Payer: Self-pay | Admitting: *Deleted

## 2012-06-12 DIAGNOSIS — M549 Dorsalgia, unspecified: Secondary | ICD-10-CM | POA: Insufficient documentation

## 2012-06-12 DIAGNOSIS — F329 Major depressive disorder, single episode, unspecified: Secondary | ICD-10-CM | POA: Insufficient documentation

## 2012-06-12 DIAGNOSIS — W208XXA Other cause of strike by thrown, projected or falling object, initial encounter: Secondary | ICD-10-CM | POA: Insufficient documentation

## 2012-06-12 DIAGNOSIS — Y93H9 Activity, other involving exterior property and land maintenance, building and construction: Secondary | ICD-10-CM | POA: Insufficient documentation

## 2012-06-12 DIAGNOSIS — H1130 Conjunctival hemorrhage, unspecified eye: Secondary | ICD-10-CM | POA: Insufficient documentation

## 2012-06-12 DIAGNOSIS — S0510XA Contusion of eyeball and orbital tissues, unspecified eye, initial encounter: Secondary | ICD-10-CM

## 2012-06-12 DIAGNOSIS — M130 Polyarthritis, unspecified: Secondary | ICD-10-CM | POA: Insufficient documentation

## 2012-06-12 DIAGNOSIS — F1910 Other psychoactive substance abuse, uncomplicated: Secondary | ICD-10-CM | POA: Insufficient documentation

## 2012-06-12 DIAGNOSIS — G8929 Other chronic pain: Secondary | ICD-10-CM | POA: Insufficient documentation

## 2012-06-12 DIAGNOSIS — F3289 Other specified depressive episodes: Secondary | ICD-10-CM | POA: Insufficient documentation

## 2012-06-12 DIAGNOSIS — D573 Sickle-cell trait: Secondary | ICD-10-CM | POA: Insufficient documentation

## 2012-06-12 DIAGNOSIS — Y929 Unspecified place or not applicable: Secondary | ICD-10-CM | POA: Insufficient documentation

## 2012-06-12 DIAGNOSIS — S0010XA Contusion of unspecified eyelid and periocular area, initial encounter: Secondary | ICD-10-CM | POA: Insufficient documentation

## 2012-06-12 MED ORDER — ERYTHROMYCIN 5 MG/GM OP OINT: TOPICAL_OINTMENT | Freq: Four times a day (QID) | OPHTHALMIC | Status: DC

## 2012-06-12 MED ORDER — FLUORESCEIN SODIUM 1 MG OP STRP
1.0000 | ORAL_STRIP | Freq: Once | OPHTHALMIC | Status: AC
  Administered 2012-06-12: 1 via OPHTHALMIC
  Filled 2012-06-12: qty 1

## 2012-06-12 MED ORDER — TETRACAINE HCL 0.5 % OP SOLN
2.0000 [drp] | Freq: Once | OPHTHALMIC | Status: AC
  Administered 2012-06-12: 2 [drp] via OPHTHALMIC
  Filled 2012-06-12: qty 2

## 2012-06-12 NOTE — ED Notes (Signed)
Pt reports left eye redness and pain also having chronic back pain and is out of ibuprofen. Ambulatory at triage with no distress

## 2012-06-12 NOTE — ED Provider Notes (Signed)
History     CSN: [id number]  Arrival date & time 06/12/12  4098   First MD Initiated Contact with Patient 06/12/12 1001      Chief Complaint  Patient presents with  . Eye Pain  . Back Pain    (Consider location/radiation/quality/duration/timing/severity/associated sxs/prior treatment) HPI Comments: Shawn Newton is a 57 y.o. Male who presents to ER with c/o eye pain for the last 2 days after a rolled up painting canvas fell on his left eye.  He says that the eye has had an aching, dull pain since and that his vision is not reduced, and that he can still see color.  He has tried taking ibuprofen for the pain, but it has not helped.  Bright light makes the pain worse.  He had an injury to the same eye many years ago, with no adverse effects.  He ranks his current pain at a 4/10.  He has no drainage from the eye and no foreign body sensation.  He also complains of back pain, in today's visit, but this is an unrelated complaint.  He has been previously diagnosed with disc disease and sciatica by Dr. Ophelia Charter and states that he is out of ibuprofen. No new injury.   Patient is a 57 y.o. male presenting with eye pain and back pain. The history is provided by the patient.  Eye Pain This is a new problem. The current episode started in the past 7 days. The problem has been unchanged. Associated symptoms include a visual change. Pertinent negatives include no abdominal pain, anorexia, arthralgias, change in bowel habit, chest pain, chills, congestion, coughing, diaphoresis, fatigue, fever, headaches, joint swelling, myalgias, nausea, neck pain, numbness, rash, sore throat, swollen glands, urinary symptoms, vertigo, vomiting or weakness.  Back Pain Associated symptoms: no abdominal pain, no chest pain, no fever, no headaches, no numbness and no weakness     Past Medical History  Diagnosis Date  . Sickle cell trait   . Chronic back pain   . Substance abuse     cocaine; not used in 1 year  .  Head injury 2010    result of falling from tree  . Arthritis   . Depression     Past Surgical History  Procedure Laterality Date  . Tonsillectomy  1970  . Patella fracture surgery  2010  . Hip fracture surgery  2010    after falling from tree  . Colonoscopy  05/25/2011    Family History  Problem Relation Age of Onset  . Colon cancer Neg Hx   . Heart disease Neg Hx   . Stomach cancer Neg Hx     History  Substance Use Topics  . Smoking status: Current Every Day Smoker    Types: Cigars  . Smokeless tobacco: Never Used     Comment: smokes 3 cigars daily  . Alcohol Use: 9 - 12 oz/week    15-20 Cans of beer per week      Review of Systems  Constitutional: Negative for fever, chills, diaphoresis and fatigue.  HENT: Positive for facial swelling. Negative for congestion, sore throat, neck pain and sinus pressure.   Eyes: Positive for photophobia, pain and redness. Negative for discharge and itching.  Respiratory: Negative.  Negative for cough.   Cardiovascular: Negative.  Negative for chest pain.  Gastrointestinal: Negative.  Negative for nausea, vomiting, abdominal pain, anorexia and change in bowel habit.  Endocrine: Negative.   Genitourinary: Negative.   Musculoskeletal: Positive for back pain. Negative for  myalgias, joint swelling and arthralgias.  Skin: Negative.  Negative for rash.  Allergic/Immunologic: Negative.   Neurological: Negative.  Negative for vertigo, weakness, numbness and headaches.  Hematological: Negative.   Psychiatric/Behavioral: Negative.   All other systems reviewed and are negative.    Allergies  Review of patient's allergies indicates no known allergies.  Home Medications   Current Outpatient Rx  Name  Route  Sig  Dispense  Refill  . Hydrocodone-Acetaminophen (VICODIN PO)   Oral   Take by mouth every 4 (four) hours.           . OXYCODONE HCL PO   Oral   Take by mouth every 4 (four) hours.             BP 123/82  Pulse 70   Temp(Src) 98.2 F (36.8 C) (Oral)  Resp 18  SpO2 98%  Physical Exam  Nursing note and vitals reviewed. Constitutional: He is oriented to person, place, and time. He appears well-developed and well-nourished. No distress.  HENT:  Head: Normocephalic and atraumatic.  Right Ear: Tympanic membrane normal.  Left Ear: Tympanic membrane normal.  Nose: Nose normal.  Mouth/Throat: Oropharynx is clear and moist.  Eyes: EOM are normal. Pupils are equal, round, and reactive to light. Right eye exhibits no discharge. Left eye exhibits no discharge.  Left eye with conjunctival injection.  The periorbital area on the left is swollen with ecchymosis. Tender to palpation.  All EOMs intact.  No abrasion seen on fluoroscein/ Woods lamp exam.  Neck: Normal range of motion. Neck supple.  Cardiovascular: Normal rate, regular rhythm, normal heart sounds and intact distal pulses.   Pulmonary/Chest: Effort normal and breath sounds normal.  Abdominal: Soft. Bowel sounds are normal.  Musculoskeletal: Normal range of motion. He exhibits no edema.  Neurological: He is alert and oriented to person, place, and time.  Skin: Skin is warm and dry. He is not diaphoretic.  Psychiatric: He has a normal mood and affect. His behavior is normal.    ED Course  Procedures (including critical care time)  Labs Reviewed - No data to display Ct Maxillofacial Wo Cm  06/12/2012  *RADIOLOGY REPORT*  Clinical Data: History of trauma complaining of left hip pain and redness.  CT MAXILLOFACIAL WITHOUT CONTRAST  Technique:  Multidetector CT imaging of the maxillofacial structures was performed. Multiplanar CT image reconstructions were also generated.  Comparison: No priors.  Findings: There is some mild soft tissue swelling around the left orbit, particularly inferiorly.  No acute displaced facial bone fracture is identified. The left globe and orbital soft tissues are normal in appearance.  The patient is edentulous.  The mandibular  condyles are properly located bilaterally.  Multifocal degenerative disc disease is noted within the visualized portions of the cervical spine.  IMPRESSION: Soft tissue contusion in the left periorbital region without displaced facial bone fracture.   Original Report Authenticated By: Trudie Reed, M.D.      1. Subconjunctival hemorrhage   2. Periorbital contusion       MDM  57 year old male with subconjunctival hemorrhage and periorbital contusion. No acute fracture seen on CT. CT obtained to 2 extreme tenderness to palpation in that area and unable to get a good assessment. He is in no apparent distress at this time. Pain only present with palpation. No visual disturbance. With lamp examination does not show any abrasion. He will followup with the eye Dr. Return precautions discussed. Patient states understanding of plan and is agreeable.  Trevor Mace, PA-C 06/12/12 1218

## 2012-06-12 NOTE — ED Notes (Signed)
Patient states 2 days ago climbing a ladder and pulling a canvas hit patient left eye.  Periorbital ecchymosis light blue purple.

## 2012-06-13 NOTE — ED Provider Notes (Signed)
Medical screening examination/treatment/procedure(s) were performed by non-physician practitioner and as supervising physician I was immediately available for consultation/collaboration.   Joya Gaskins, MD 06/13/12 0830

## 2012-09-30 ENCOUNTER — Ambulatory Visit: Payer: Self-pay | Admitting: Internal Medicine

## 2012-10-31 ENCOUNTER — Ambulatory Visit (INDEPENDENT_AMBULATORY_CARE_PROVIDER_SITE_OTHER): Payer: 59 | Admitting: Internal Medicine

## 2012-10-31 ENCOUNTER — Encounter: Payer: Self-pay | Admitting: Internal Medicine

## 2012-10-31 ENCOUNTER — Other Ambulatory Visit: Payer: Self-pay | Admitting: Internal Medicine

## 2012-10-31 VITALS — BP 160/92 | HR 52 | Temp 97.4°F | Resp 16 | Ht 68.9 in | Wt 163.8 lb

## 2012-10-31 DIAGNOSIS — F1990 Other psychoactive substance use, unspecified, uncomplicated: Secondary | ICD-10-CM

## 2012-10-31 DIAGNOSIS — M5432 Sciatica, left side: Secondary | ICD-10-CM

## 2012-10-31 DIAGNOSIS — R634 Abnormal weight loss: Secondary | ICD-10-CM

## 2012-10-31 DIAGNOSIS — Z0000 Encounter for general adult medical examination without abnormal findings: Secondary | ICD-10-CM

## 2012-10-31 DIAGNOSIS — M5430 Sciatica, unspecified side: Secondary | ICD-10-CM

## 2012-10-31 DIAGNOSIS — F1910 Other psychoactive substance abuse, uncomplicated: Secondary | ICD-10-CM

## 2012-10-31 MED ORDER — GABAPENTIN 100 MG PO CAPS: 100.0000 mg | ORAL_CAPSULE | Freq: Three times a day (TID) | ORAL | Status: DC

## 2012-10-31 NOTE — Patient Instructions (Addendum)
Recommend follow up with an eye doctor.    I have ordered neurontin 100mg  three times daily for you for your nerve pain and back pain going down your leg.  I can increase the dosage of this if you are not getting relief.  It is good for sciatica pain.  I have done the form and faxed it for your low back.  I am concerned about your weight loss with your drug history.  I have ordered screening tests for hepatitis and HIV to make sure you do not have these.    Please come in next week for a blood pressure check with the medical assistants.
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
## 2012-11-01 LAB — COMPREHENSIVE METABOLIC PANEL
ALT: 32 IU/L (ref 0–44)
AST: 23 IU/L (ref 0–40)
Albumin/Globulin Ratio: 2 (ref 1.1–2.5)
Albumin: 4.4 g/dL (ref 3.5–5.5)
Alkaline Phosphatase: 102 IU/L (ref 39–117)
BUN/Creatinine Ratio: 13 (ref 9–20)
BUN: 11 mg/dL (ref 6–24)
CO2: 21 mmol/L (ref 18–29)
Calcium: 9.5 mg/dL (ref 8.7–10.2)
Chloride: 102 mmol/L (ref 97–108)
Creatinine, Ser: 0.88 mg/dL (ref 0.76–1.27)
GFR calc Af Amer: 110 mL/min/{1.73_m2} (ref >59)
GFR calc non Af Amer: 95 mL/min/{1.73_m2} (ref >59)
Globulin, Total: 2.2 g/dL (ref 1.5–4.5)
Glucose: 94 mg/dL (ref 65–99)
Potassium: 4 mmol/L (ref 3.5–5.2)
Sodium: 143 mmol/L (ref 134–144)
Total Bilirubin: 0.3 mg/dL (ref 0.0–1.2)
Total Protein: 6.6 g/dL (ref 6.0–8.5)

## 2012-11-01 LAB — CBC WITH DIFFERENTIAL/PLATELET
Basophils Absolute: 0 10*3/uL (ref 0.0–0.2)
Basos: 0 % (ref 0–3)
Eos: 1 % (ref 0–5)
Eosinophils Absolute: 0.1 10*3/uL (ref 0.0–0.4)
HCT: 46 % (ref 37.5–51.0)
Hemoglobin: 15.9 g/dL (ref 12.6–17.7)
Immature Grans (Abs): 0 10*3/uL (ref 0.0–0.1)
Immature Granulocytes: 0 % (ref 0–2)
Lymphocytes Absolute: 1.8 10*3/uL (ref 0.7–3.1)
Lymphs: 23 % (ref 14–46)
MCH: 32.3 pg (ref 26.6–33.0)
MCHC: 34.6 g/dL (ref 31.5–35.7)
MCV: 94 fL (ref 79–97)
Monocytes Absolute: 0.8 10*3/uL (ref 0.1–0.9)
Monocytes: 10 % (ref 4–12)
Neutrophils Absolute: 5.1 10*3/uL (ref 1.4–7.0)
Neutrophils Relative %: 66 % (ref 40–74)
RBC: 4.92 x10E6/uL (ref 4.14–5.80)
RDW: 14.7 % (ref 12.3–15.4)
WBC: 7.9 10*3/uL (ref 3.4–10.8)

## 2012-11-01 LAB — LIPID PANEL
Chol/HDL Ratio: 3.1 ratio units (ref 0.0–5.0)
Cholesterol, Total: 135 mg/dL (ref 100–199)
HDL: 44 mg/dL (ref >39)
LDL Calculated: 76 mg/dL (ref 0–99)
Triglycerides: 75 mg/dL (ref 0–149)
VLDL Cholesterol Cal: 15 mg/dL (ref 5–40)

## 2012-11-04 LAB — RNA, PCR (GRAPH) RFX/GENO: HIV1 RNA # SerPl PCR: <20 copies/mL

## 2012-11-07 ENCOUNTER — Ambulatory Visit (INDEPENDENT_AMBULATORY_CARE_PROVIDER_SITE_OTHER): Payer: 59 | Admitting: Internal Medicine

## 2012-11-07 ENCOUNTER — Encounter: Payer: Self-pay | Admitting: Internal Medicine

## 2012-11-07 VITALS — BP 122/80 | HR 68 | Resp 18 | Ht 68.0 in | Wt 161.8 lb

## 2012-11-07 DIAGNOSIS — M5432 Sciatica, left side: Secondary | ICD-10-CM

## 2012-11-07 DIAGNOSIS — M5430 Sciatica, unspecified side: Secondary | ICD-10-CM

## 2012-11-07 DIAGNOSIS — R634 Abnormal weight loss: Secondary | ICD-10-CM

## 2012-11-07 DIAGNOSIS — B49 Unspecified mycosis: Secondary | ICD-10-CM

## 2012-11-07 DIAGNOSIS — J329 Chronic sinusitis, unspecified: Secondary | ICD-10-CM

## 2012-11-07 DIAGNOSIS — B369 Superficial mycosis, unspecified: Secondary | ICD-10-CM

## 2012-11-07 LAB — HBV/HCV (PROFILE VIII)
HCV Ab: <0.1 s/co ratio (ref 0.0–0.9)
Hep B C IgM: NEGATIVE
Hep B Core Total Ab: POSITIVE — AB
Hep B E Ab: POSITIVE — AB
Hep B E Ag: NEGATIVE
Hep B Surface Ab, Qual: NONREACTIVE
Hepatitis B Surface Ag: NEGATIVE

## 2012-11-07 LAB — HCV COMMENT:

## 2012-11-07 LAB — SPECIMEN STATUS REPORT

## 2012-11-07 MED ORDER — GABAPENTIN 100 MG PO CAPS: 300.0000 mg | ORAL_CAPSULE | Freq: Three times a day (TID) | ORAL | Status: DC

## 2012-11-07 MED ORDER — TRIAMCINOLONE ACETONIDE 0.1 % EX CREA: TOPICAL_CREAM | Freq: Two times a day (BID) | CUTANEOUS | Status: DC

## 2012-11-07 NOTE — Patient Instructions (Signed)
Increase gabapentin to 300mg  to three times per day.    Try the sinus rinse for your congestion.

## 2012-11-07 NOTE — Progress Notes (Signed)
Patient ID: Shawn Newton, male   DOB: [birth year], 57 y.o.   MRN: [id number] Location:  [facility] / [facility]   No Known Allergies  Chief Complaint  Patient presents with  . Follow-up    HPI: Patient is a 57 y.o. AA male seen in the office today for f/u of his chronic conditions.  He had labs since last week.  Sweated and down two lbs since last week.  Says he lacks appetite at times.    Does not have any known h/o hepatitis exposure.  Did have prior IV drug use.  Unclear if he has been vaccinated for hep B.  Looks like he was exposed based on the lab results.  Used to go to [employer]  Had an infection with mites or bedbugs.  Seems to be clearing up now.  Discussed safe sex, avoiding any IV or snorting of drugs.  He says he does use cocaine sometimes, but not consistently.  Does not use any IV drugs.  Has never had a positive PPD.      Review of Systems:  Review of Systems  Constitutional: Negative for fever and malaise/fatigue.  HENT: Negative for congestion and hearing loss.   Eyes: Negative for blurred vision.  Cardiovascular: Negative for chest pain and leg swelling.  Gastrointestinal: Negative for heartburn, abdominal pain, diarrhea, constipation, blood in stool and melena.  Genitourinary: Negative for dysuria.  Musculoskeletal: Positive for back pain.  Skin: Negative for rash.  Neurological: Positive for sensory change. Negative for headaches.  Endo/Heme/Allergies: Does not bruise/bleed easily.  Psychiatric/Behavioral: Negative for depression.     Past Medical History  Diagnosis Date  . Sickle cell trait   . Chronic back pain   . Substance abuse     cocaine; not used in 1 year  . Head injury 2010    result of falling from tree  . Arthritis   . Depression     Past Surgical History  Procedure Laterality Date  . Tonsillectomy  1970  . Patella fracture surgery  2010  . Hip fracture surgery  2010    after falling from tree   . Colonoscopy  05/25/2011    Social History:   reports that he has been smoking Cigars.  He has never used smokeless tobacco. He reports that he drinks about 9.0 ounces of alcohol per week. He reports that he does not use illicit drugs.  Family History  Problem Relation Age of Onset  . Colon cancer Neg Hx   . Heart disease Neg Hx   . Stomach cancer Neg Hx     Medications: Patient's Medications  New Prescriptions   No medications on file  Previous Medications   GABAPENTIN (NEURONTIN) 100 MG CAPSULE    Take 1 capsule (100 mg total) by mouth 3 (three) times daily.   TRIAMCINOLONE CREAM (KENALOG) 0.1 %      Modified Medications   No medications on file  Discontinued Medications   No medications on file     Physical Exam: Filed Vitals:   11/07/12 1555  BP: 122/80  Pulse: 68  Resp: 18  Height: 5\' 8"  (1.727 m)  Weight: 161 lb 12.8 oz (73.392 kg)  SpO2: 98%  Physical Exam  Constitutional: He is oriented to person, place, and time. No distress.  Cardiovascular: Normal rate, regular rhythm, normal heart sounds and intact distal pulses.   Pulmonary/Chest: Effort normal and breath sounds normal. No respiratory distress.  Abdominal: Soft. Bowel sounds are  normal. He exhibits no distension and no mass. There is no tenderness.  No palpable hepatomegaly  Musculoskeletal: Normal range of motion. He exhibits tenderness.  Over sacroiliac joint and buttocks  Neurological: He is alert and oriented to person, place, and time.  Skin:  Raised macular rash diffusely  Psychiatric:  anxious    Labs reviewed: Basic Metabolic Panel:  Recent Labs  86/57/84 1015  NA 143  K 4.0  CL 102  CO2 21  GLUCOSE 94  BUN 11  CREATININE 0.88  CALCIUM 9.5   Liver Function Tests:  Recent Labs  10/31/12 1015  AST 23  ALT 32  ALKPHOS 102  BILITOT 0.3  PROT 6.6   Recent Labs  10/31/12 1015  WBC 7.9  NEUTROABS 5.1  HGB 15.9  HCT 46.0  MCV 94   Lipid Panel:  Recent Labs   10/31/12 1015  HDL 44  LDLCALC 76  TRIG 75  CHOLHDL 3.1    Assessment/Plan 1. Sciatica of left side -try gabapentin increased does--if not successful, switch to lyrica -I will not give him narcotics due to ongoing cocaine use  2. Loss of weight -may be due to drug abuse, hep b, recommend further testing for infectious diseases at next visit  3. Sinusitis, chronic -recommended otc zyrtec  4. Fungal infection of skin -antifungal cream prescribed  Next appt:  3 mos

## 2012-11-14 ENCOUNTER — Other Ambulatory Visit: Payer: Self-pay | Admitting: Geriatric Medicine

## 2012-11-14 DIAGNOSIS — M5432 Sciatica, left side: Secondary | ICD-10-CM

## 2012-11-14 MED ORDER — GABAPENTIN 300 MG PO CAPS: 300.0000 mg | ORAL_CAPSULE | Freq: Three times a day (TID) | ORAL | Status: DC

## 2012-11-14 MED ORDER — GABAPENTIN 100 MG PO CAPS: 300.0000 mg | ORAL_CAPSULE | Freq: Three times a day (TID) | ORAL | Status: DC

## 2012-11-19 ENCOUNTER — Telehealth: Payer: Self-pay | Admitting: Geriatric Medicine

## 2012-11-19 NOTE — Telephone Encounter (Signed)
The patient came in and said that the gabapentin is not working. Can you please give him something else? Thanks.

## 2012-11-20 NOTE — Telephone Encounter (Signed)
D/c gabapentin.  Lyrica 75mg  po bid.  #60.  This will require a signature at the office and for him to pick it up.  I am not going to prescribe narcotics to this man who uses cocaine.

## 2012-11-26 NOTE — Telephone Encounter (Signed)
Unable to reach patient.

## 2012-12-20 ENCOUNTER — Encounter: Payer: Self-pay | Admitting: Geriatric Medicine

## 2013-03-31 ENCOUNTER — Ambulatory Visit: Payer: 59 | Admitting: Internal Medicine

## 2013-03-31 ENCOUNTER — Encounter: Payer: Self-pay | Admitting: *Deleted

## 2013-03-31 DIAGNOSIS — Z0289 Encounter for other administrative examinations: Secondary | ICD-10-CM

## 2013-04-10 ENCOUNTER — Other Ambulatory Visit: Payer: Self-pay | Admitting: *Deleted

## 2013-04-10 DIAGNOSIS — B369 Superficial mycosis, unspecified: Secondary | ICD-10-CM

## 2013-04-10 MED ORDER — TRIAMCINOLONE ACETONIDE 0.1 % EX CREA: TOPICAL_CREAM | Freq: Two times a day (BID) | CUTANEOUS | Status: DC

## 2013-05-10 ENCOUNTER — Encounter: Payer: Self-pay | Admitting: Internal Medicine

## 2013-05-23 ENCOUNTER — Emergency Department (HOSPITAL_COMMUNITY)
Admission: EM | Admit: 2013-05-23 | Discharge: 2013-05-23 | Disposition: A | Discharge status: Home / Self Care | Payer: No Typology Code available for payment source | Attending: Emergency Medicine | Admitting: Emergency Medicine

## 2013-05-23 ENCOUNTER — Emergency Department (HOSPITAL_COMMUNITY): Payer: No Typology Code available for payment source

## 2013-05-23 ENCOUNTER — Encounter (HOSPITAL_COMMUNITY): Payer: Self-pay | Admitting: Emergency Medicine

## 2013-05-23 DIAGNOSIS — Z9889 Other specified postprocedural states: Secondary | ICD-10-CM | POA: Diagnosis not present

## 2013-05-23 DIAGNOSIS — S0990XA Unspecified injury of head, initial encounter: Secondary | ICD-10-CM

## 2013-05-23 DIAGNOSIS — S5000XA Contusion of unspecified elbow, initial encounter: Secondary | ICD-10-CM | POA: Insufficient documentation

## 2013-05-23 DIAGNOSIS — S5002XA Contusion of left elbow, initial encounter: Secondary | ICD-10-CM

## 2013-05-23 DIAGNOSIS — Y92410 Unspecified street and highway as the place of occurrence of the external cause: Secondary | ICD-10-CM | POA: Insufficient documentation

## 2013-05-23 DIAGNOSIS — F329 Major depressive disorder, single episode, unspecified: Secondary | ICD-10-CM | POA: Diagnosis not present

## 2013-05-23 DIAGNOSIS — Z79899 Other long term (current) drug therapy: Secondary | ICD-10-CM | POA: Diagnosis not present

## 2013-05-23 DIAGNOSIS — F3289 Other specified depressive episodes: Secondary | ICD-10-CM | POA: Diagnosis not present

## 2013-05-23 DIAGNOSIS — Y9389 Activity, other specified: Secondary | ICD-10-CM | POA: Insufficient documentation

## 2013-05-23 DIAGNOSIS — F17200 Nicotine dependence, unspecified, uncomplicated: Secondary | ICD-10-CM | POA: Diagnosis not present

## 2013-05-23 DIAGNOSIS — Z8739 Personal history of other diseases of the musculoskeletal system and connective tissue: Secondary | ICD-10-CM | POA: Diagnosis not present

## 2013-05-23 DIAGNOSIS — S8000XA Contusion of unspecified knee, initial encounter: Secondary | ICD-10-CM | POA: Insufficient documentation

## 2013-05-23 DIAGNOSIS — S161XXA Strain of muscle, fascia and tendon at neck level, initial encounter: Secondary | ICD-10-CM

## 2013-05-23 DIAGNOSIS — S335XXA Sprain of ligaments of lumbar spine, initial encounter: Secondary | ICD-10-CM | POA: Insufficient documentation

## 2013-05-23 DIAGNOSIS — S060X9A Concussion with loss of consciousness of unspecified duration, initial encounter: Secondary | ICD-10-CM | POA: Insufficient documentation

## 2013-05-23 DIAGNOSIS — S8002XA Contusion of left knee, initial encounter: Secondary | ICD-10-CM

## 2013-05-23 DIAGNOSIS — IMO0002 Reserved for concepts with insufficient information to code with codable children: Secondary | ICD-10-CM | POA: Insufficient documentation

## 2013-05-23 DIAGNOSIS — S39012A Strain of muscle, fascia and tendon of lower back, initial encounter: Secondary | ICD-10-CM

## 2013-05-23 DIAGNOSIS — S139XXA Sprain of joints and ligaments of unspecified parts of neck, initial encounter: Secondary | ICD-10-CM | POA: Diagnosis not present

## 2013-05-23 DIAGNOSIS — Z862 Personal history of diseases of the blood and blood-forming organs and certain disorders involving the immune mechanism: Secondary | ICD-10-CM | POA: Insufficient documentation

## 2013-05-23 MED ORDER — HYDROCODONE-ACETAMINOPHEN 5-325 MG PO TABS: 2.0000 | ORAL_TABLET | ORAL | Status: DC | PRN

## 2013-05-23 MED ORDER — HYDROCODONE-ACETAMINOPHEN 5-325 MG PO TABS
2.0000 | ORAL_TABLET | Freq: Once | ORAL | Status: AC
  Administered 2013-05-23: 2 via ORAL
  Filled 2013-05-23: qty 2
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
## 2013-06-12 ENCOUNTER — Ambulatory Visit: Payer: Self-pay | Admitting: Internal Medicine

## 2013-06-13 ENCOUNTER — Other Ambulatory Visit: Payer: Self-pay | Admitting: *Deleted

## 2013-06-14 ENCOUNTER — Encounter (HOSPITAL_COMMUNITY): Payer: Self-pay | Admitting: Emergency Medicine

## 2013-06-14 ENCOUNTER — Emergency Department (HOSPITAL_COMMUNITY)
Admission: EM | Admit: 2013-06-14 | Discharge: 2013-06-14 | Disposition: A | Discharge status: Home / Self Care | Payer: No Typology Code available for payment source | Attending: Emergency Medicine | Admitting: Emergency Medicine

## 2013-06-14 DIAGNOSIS — F17200 Nicotine dependence, unspecified, uncomplicated: Secondary | ICD-10-CM | POA: Insufficient documentation

## 2013-06-14 DIAGNOSIS — Z8659 Personal history of other mental and behavioral disorders: Secondary | ICD-10-CM | POA: Diagnosis not present

## 2013-06-14 DIAGNOSIS — IMO0002 Reserved for concepts with insufficient information to code with codable children: Secondary | ICD-10-CM | POA: Insufficient documentation

## 2013-06-14 DIAGNOSIS — M129 Arthropathy, unspecified: Secondary | ICD-10-CM | POA: Insufficient documentation

## 2013-06-14 DIAGNOSIS — Z862 Personal history of diseases of the blood and blood-forming organs and certain disorders involving the immune mechanism: Secondary | ICD-10-CM | POA: Diagnosis not present

## 2013-06-14 DIAGNOSIS — M533 Sacrococcygeal disorders, not elsewhere classified: Secondary | ICD-10-CM | POA: Diagnosis not present

## 2013-06-14 DIAGNOSIS — Z87828 Personal history of other (healed) physical injury and trauma: Secondary | ICD-10-CM | POA: Insufficient documentation

## 2013-06-14 DIAGNOSIS — Z8781 Personal history of (healed) traumatic fracture: Secondary | ICD-10-CM | POA: Insufficient documentation

## 2013-06-14 DIAGNOSIS — Z9181 History of falling: Secondary | ICD-10-CM | POA: Diagnosis not present

## 2013-06-14 DIAGNOSIS — R269 Unspecified abnormalities of gait and mobility: Secondary | ICD-10-CM | POA: Insufficient documentation

## 2013-06-14 DIAGNOSIS — M549 Dorsalgia, unspecified: Secondary | ICD-10-CM | POA: Diagnosis present

## 2013-06-14 DIAGNOSIS — G8929 Other chronic pain: Secondary | ICD-10-CM | POA: Insufficient documentation

## 2013-06-14 MED ORDER — HYDROCODONE-ACETAMINOPHEN 5-325 MG PO TABS: 1.0000 | ORAL_TABLET | ORAL | Status: DC | PRN

## 2013-06-14 NOTE — Discharge Instructions (Signed)
Call for a follow up appointment with a Family or Primary Care Provider.  Call Dr. Lorin Mercy for further management of your pain or possible surgery as previously scheduled and discussed in the past. Return if Symptoms worsen.   Take medication as prescribed.
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
## 2013-06-14 NOTE — ED Notes (Signed)
Pt cussing and states he wants more pain medication. PA notified. Pt encouraged to follow up with PCP for management of chronic pain.
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
## 2013-06-15 NOTE — ED Provider Notes (Signed)
Medical screening examination/treatment/procedure(s) were performed by non-physician practitioner and as supervising physician I was immediately available for consultation/collaboration.  EKG Interpretation   None         Maudry Diego, MD 06/15/13 [phone]

## 2013-06-17 ENCOUNTER — Emergency Department (HOSPITAL_COMMUNITY)
Admission: EM | Admit: 2013-06-17 | Discharge: 2013-06-17 | Disposition: A | Discharge status: Home / Self Care | Payer: Medicare HMO | Attending: Emergency Medicine | Admitting: Emergency Medicine

## 2013-06-17 ENCOUNTER — Encounter (HOSPITAL_COMMUNITY): Payer: Self-pay | Admitting: Emergency Medicine

## 2013-06-17 DIAGNOSIS — M129 Arthropathy, unspecified: Secondary | ICD-10-CM | POA: Insufficient documentation

## 2013-06-17 DIAGNOSIS — F17200 Nicotine dependence, unspecified, uncomplicated: Secondary | ICD-10-CM | POA: Insufficient documentation

## 2013-06-17 DIAGNOSIS — M5430 Sciatica, unspecified side: Secondary | ICD-10-CM | POA: Insufficient documentation

## 2013-06-17 DIAGNOSIS — Z87828 Personal history of other (healed) physical injury and trauma: Secondary | ICD-10-CM | POA: Insufficient documentation

## 2013-06-17 DIAGNOSIS — M5431 Sciatica, right side: Secondary | ICD-10-CM

## 2013-06-17 DIAGNOSIS — Z8659 Personal history of other mental and behavioral disorders: Secondary | ICD-10-CM | POA: Insufficient documentation

## 2013-06-17 DIAGNOSIS — G8921 Chronic pain due to trauma: Secondary | ICD-10-CM | POA: Insufficient documentation

## 2013-06-17 DIAGNOSIS — M549 Dorsalgia, unspecified: Secondary | ICD-10-CM

## 2013-06-17 DIAGNOSIS — Z862 Personal history of diseases of the blood and blood-forming organs and certain disorders involving the immune mechanism: Secondary | ICD-10-CM | POA: Insufficient documentation

## 2013-06-17 HISTORY — DX: Sciatica, unspecified side: M54.30

## 2013-06-17 MED ORDER — HYDROCODONE-ACETAMINOPHEN 5-325 MG PO TABS: 2.0000 | ORAL_TABLET | ORAL | Status: DC | PRN

## 2013-06-17 MED ORDER — HYDROMORPHONE HCL PF 1 MG/ML IJ SOLN
1.0000 mg | Freq: Once | INTRAMUSCULAR | Status: AC
  Administered 2013-06-17: 1 mg via INTRAMUSCULAR
  Filled 2013-06-17: qty 1

## 2013-06-17 MED ORDER — CYCLOBENZAPRINE HCL 10 MG PO TABS: 10.0000 mg | ORAL_TABLET | Freq: Two times a day (BID) | ORAL | Status: DC | PRN

## 2013-06-17 NOTE — ED Notes (Signed)
Pt reports pain feels like sciatica which he has a same of on the right.

## 2013-06-17 NOTE — ED Notes (Signed)
MD at bedside. 

## 2013-06-17 NOTE — Discharge Instructions (Signed)
Medication for pain and muscle spasm. Follow up with Dr. Lorin Mercy.
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
## 2013-06-23 ENCOUNTER — Other Ambulatory Visit: Payer: Self-pay | Admitting: *Deleted

## 2013-06-23 ENCOUNTER — Telehealth: Payer: Self-pay | Admitting: General Practice

## 2013-06-23 MED ORDER — TRIAMCINOLONE ACETONIDE 0.1 % EX CREA
1.0000 | TOPICAL_CREAM | Freq: Two times a day (BID) | CUTANEOUS | Status: DC
Start: 2013-06-23 — End: 2013-10-13

## 2013-06-23 NOTE — Telephone Encounter (Signed)
Pt called, demanding he needs a referral to a pain clinic.  Pt was informed that we need him to establish care first in order to put referral in place.  Pt was told, by another another clinic, that we would be able to authorized a first times visit the issues is that pt does not have our clinic listed as PCP on Medicaid Card.  [facility] in [facility] is listed as PCP.  We can change give pt a sooner appointment but the main issue is that Medicaid will approve referral unless we are listed on card.  Pt irrately explained using expletives that he does not have time to sit around and wait and wishes to speak to someone else, call was transferred to supervisor.

## 2013-08-05 ENCOUNTER — Ambulatory Visit: Payer: Medicare Other | Admitting: Internal Medicine

## 2013-10-13 ENCOUNTER — Ambulatory Visit: Payer: Medicare HMO | Attending: Internal Medicine | Admitting: Internal Medicine

## 2013-10-13 ENCOUNTER — Encounter: Payer: Self-pay | Admitting: Internal Medicine

## 2013-10-13 VITALS — BP 128/86 | HR 69 | Temp 98.0°F | Resp 16 | Wt 152.6 lb

## 2013-10-13 DIAGNOSIS — H538 Other visual disturbances: Secondary | ICD-10-CM | POA: Diagnosis not present

## 2013-10-13 DIAGNOSIS — R21 Rash and other nonspecific skin eruption: Secondary | ICD-10-CM | POA: Diagnosis not present

## 2013-10-13 DIAGNOSIS — G8929 Other chronic pain: Secondary | ICD-10-CM | POA: Diagnosis not present

## 2013-10-13 DIAGNOSIS — Z0000 Encounter for general adult medical examination without abnormal findings: Secondary | ICD-10-CM | POA: Insufficient documentation

## 2013-10-13 DIAGNOSIS — F17200 Nicotine dependence, unspecified, uncomplicated: Secondary | ICD-10-CM | POA: Diagnosis not present

## 2013-10-13 DIAGNOSIS — M545 Low back pain, unspecified: Secondary | ICD-10-CM | POA: Insufficient documentation

## 2013-10-13 MED ORDER — TRIAMCINOLONE ACETONIDE 0.1 % EX CREA: 1.0000 "application " | TOPICAL_CREAM | Freq: Two times a day (BID) | CUTANEOUS | Status: DC

## 2013-10-13 MED ORDER — TRIAMCINOLONE ACETONIDE 0.1 % EX CREA
1.0000 | TOPICAL_CREAM | Freq: Two times a day (BID) | CUTANEOUS | Status: DC
Start: 2013-10-13 — End: 2013-12-04

## 2013-10-13 NOTE — Progress Notes (Signed)
Patient Demographics  Shawn Newton, is a 58 y.o. male  ZOX:[id number]  [id number]  DOB - 09-07-1955  CC:  Chief Complaint  Patient presents with  . Establish Care       HPI: Shawn Newton is a 58 y.o. male here today to establish medical care. History of chronic lower back pain used to take something medication the past but does not want to take any pain medication, patient's only concern today is refill on his medication refill for cream which he has been using for the rash on his lower legs as per patient he saw a dermatologist in the past and was prescribed Kenalog whenever he uses the medication the rash disappears, denies any fever chills chest and shortness of breath patient does smoke cigarettes, I have advised patient to quit smoking, patient has family history of diabetes, I have discussed with patient to do blood work and in that way we can check for diabetes, patient is reluctant he does not want any blood work done he reported to have blurry vision for long time. Patient has No headache, No chest pain, No abdominal pain - No Nausea, No new weakness tingling or numbness, No Cough - SOB.  No Known Allergies Past Medical History  Diagnosis Date  . Sickle cell trait   . Chronic back pain   . Substance abuse     cocaine; not used in 1 year  . Head injury 2010    result of falling from tree  . Arthritis   . Depression   . Sciatica     left   Current Outpatient Prescriptions on File Prior to Visit  Medication Sig Dispense Refill  . cyclobenzaprine (FLEXERIL) 10 MG tablet Take 1 tablet (10 mg total) by mouth 2 (two) times daily as needed for muscle spasms.  20 tablet  0  . HYDROcodone-acetaminophen (NORCO) 5-325 MG per tablet Take 2 tablets by mouth every 4 (four) hours as needed.  20 tablet  0  . HYDROcodone-acetaminophen (NORCO/VICODIN) 5-325 MG per tablet Take 1 tablet by mouth every 6 (six) hours as needed for moderate pain.       No current  facility-administered medications on file prior to visit.   Family History  Problem Relation Age of Onset  . Colon cancer Neg Hx   . Heart disease Neg Hx   . Stomach cancer Neg Hx   . Diabetes Father   . Diabetes Sister    History   Social History  . Marital Status: Single    Spouse Name: N/A    Number of Children: N/A  . 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 Smoking status: Current Every Day Smoker -- 0.00 packs/day    Types: Cigars  . Smokeless tobacco: Never Used     Comment: smokes 3 cigars daily  . Alcohol Use: 9.0 - 12.0 oz/week    15-20 Cans of beer per week     Comment: social  . Drug Use: No     Comment: used cocaine for 10 years/not used in 1 year  . Sexual Activity: Not on file   Other Topics Concern  . Not on file   Social History Narrative  . No narrative on file    Review of Systems: Constitutional: Negative for fever, chills, diaphoresis, activity change, appetite change and fatigue. HENT: Negative for ear pain, nosebleeds, congestion,  facial swelling, rhinorrhea, neck pain, neck stiffness and ear discharge.  Eyes: Negative for pain, discharge, redness, itching and visual disturbance. Respiratory: Negative for cough, choking, chest tightness, shortness of breath, wheezing and stridor.  Cardiovascular: Negative for chest pain, palpitations and leg swelling. Gastrointestinal: Negative for abdominal distention. Genitourinary: Negative for dysuria, urgency, frequency, hematuria, flank pain, decreased urine volume, difficulty urinating and dyspareunia.  Musculoskeletal: Negative for back pain, joint swelling, arthralgia and gait problem. Neurological: Negative for dizziness, tremors, seizures, syncope, facial asymmetry, speech difficulty, weakness, light-headedness, numbness and headaches.  Hematological: Negative for adenopathy. Does not bruise/bleed  easily. Psychiatric/Behavioral: Negative for hallucinations, behavioral problems, confusion, dysphoric mood, decreased concentration and agitation.    Objective:   Filed Vitals:   10/13/13 0957  BP: 128/86  Pulse: 69  Temp: 98 F (36.7 C)  Resp: 16    Physical Exam: Constitutional: Patient appears well-developed and well-nourished. No distress. HENT: Normocephalic, atraumatic, External right and left ear normal. Oropharynx is clear and moist.  Eyes: Conjunctivae and EOM are normal. PERRLA, no scleral icterus. Neck: Normal ROM. Neck supple. No JVD. No tracheal deviation. No thyromegaly. CVS: RRR, S1/S2 +, no murmurs, no gallops, no carotid bruit.  Pulmonary: Effort and breath sounds normal, no stridor, rhonchi, wheezes, rales.  Abdominal: Soft. BS +, no distension, tenderness, rebound or guarding.  Musculoskeletal: Normal range of motion. No edema and no tenderness. Some lower lumbar paraspinal tenderness.  Neuro: Alert. Normal reflexes, muscle tone coordination. No cranial nerve deficit. Skin: Skin has rash on both legs/shin Psychiatric: Normal mood and affect. Behavior, judgment, thought content normal.  Lab Results  Component Value Date   WBC 7.9 10/31/2012   HGB 15.9 10/31/2012   HCT 46.0 10/31/2012   MCV 94 10/31/2012   PLT 218 11/07/2010   Lab Results  Component Value Date   CREATININE 0.88 10/31/2012   BUN 11 10/31/2012   NA 143 10/31/2012   K 4.0 10/31/2012   CL 102 10/31/2012   CO2 21 10/31/2012    No results found for this basename: HGBA1C   Lipid Panel     Component Value Date/Time   CHOL 141 07/24/2007 2028   TRIG 75 10/31/2012 1015   HDL 44 10/31/2012 1015   HDL 38* 07/24/2007 2028   CHOLHDL 3.1 10/31/2012 1015   CHOLHDL 3.7 Ratio 07/24/2007 2028   VLDL 22 07/24/2007 2028   LDLCALC 76 10/31/2012 1015   LDLCALC 81 07/24/2007 2028       Assessment and plan:   1. Rash and nonspecific skin eruption Patient is given refill on Kenalog.  - triamcinolone cream (KENALOG) 0.1 %;  Apply 1 application topically 2 (two) times daily.  Dispense: 30 g; Refill: 2  2. Smoking Advised patient to quit smoking  3. Chronic low back pain Patient does not want to take any pain medications.  4. Blurry vision  - Ambulatory referral to Ophthalmology  Patient is reluctant to do any blood work, will discuss again on the next visit.     Return in about 3 months (around 01/13/2014) for Rash .       Lorayne Marek, MD

## 2013-10-13 NOTE — Progress Notes (Signed)
Patient here to establish care Needs refill on kenalog cream

## 2013-12-04 ENCOUNTER — Other Ambulatory Visit: Payer: Self-pay

## 2013-12-04 DIAGNOSIS — R21 Rash and other nonspecific skin eruption: Secondary | ICD-10-CM

## 2013-12-04 MED ORDER — TRIAMCINOLONE ACETONIDE 0.1 % EX CREA: 1.0000 "application " | TOPICAL_CREAM | Freq: Two times a day (BID) | CUTANEOUS | Status: DC

## 2013-12-05 ENCOUNTER — Other Ambulatory Visit: Payer: Self-pay

## 2013-12-05 DIAGNOSIS — R21 Rash and other nonspecific skin eruption: Secondary | ICD-10-CM

## 2013-12-05 MED ORDER — TRIAMCINOLONE ACETONIDE 0.1 % EX CREA: 1.0000 "application " | TOPICAL_CREAM | Freq: Two times a day (BID) | CUTANEOUS | Status: DC

## 2014-01-14 ENCOUNTER — Ambulatory Visit: Payer: Medicare Other | Admitting: Internal Medicine

## 2014-03-16 ENCOUNTER — Telehealth: Payer: Self-pay | Admitting: Internal Medicine

## 2014-03-16 ENCOUNTER — Other Ambulatory Visit: Payer: Self-pay | Admitting: Emergency Medicine

## 2014-03-16 DIAGNOSIS — R21 Rash and other nonspecific skin eruption: Secondary | ICD-10-CM

## 2014-03-16 MED ORDER — TRIAMCINOLONE ACETONIDE 0.1 % EX CREA: 1.0000 "application " | TOPICAL_CREAM | Freq: Two times a day (BID) | CUTANEOUS | Status: DC

## 2014-03-16 NOTE — Telephone Encounter (Signed)
Patient walked in office to request medication refill for triamcinolone cream (KENALOG) 0.1 % Patient states this cream was prescribed to him in the past once in a tube and another time in a jar. Patient states the jar holds more cream and he would like refill in jar. Please assist.

## 2014-03-23 ENCOUNTER — Telehealth: Payer: Self-pay | Admitting: Internal Medicine

## 2014-03-23 NOTE — Telephone Encounter (Signed)
Patient has come in today to request medication triamcinolone cream (KENALOG) 0.1 % be given in a jar instead of a tube; please f/u with patient about this request;

## 2014-03-25 ENCOUNTER — Other Ambulatory Visit: Payer: Self-pay | Admitting: *Deleted

## 2014-03-25 DIAGNOSIS — R21 Rash and other nonspecific skin eruption: Secondary | ICD-10-CM

## 2014-03-25 MED ORDER — TRIAMCINOLONE ACETONIDE 0.1 % EX CREA: 1.0000 "application " | TOPICAL_CREAM | Freq: Two times a day (BID) | CUTANEOUS | Status: DC

## 2014-03-25 NOTE — Progress Notes (Unsigned)
Pt called requesting a jar of his kenalog cream. I sent the rx to our pharmacy.

## 2014-04-16 ENCOUNTER — Telehealth: Payer: Self-pay | Admitting: *Deleted

## 2014-04-16 NOTE — Telephone Encounter (Signed)
Patient aware that he is overdue for f/u with PCP for rash. (Due September 2015) Patient will call back to schedule an appt.

## 2014-04-28 ENCOUNTER — Ambulatory Visit: Payer: Medicare Other | Admitting: Internal Medicine

## 2014-11-09 ENCOUNTER — Ambulatory Visit: Payer: Medicare Other | Admitting: Internal Medicine

## 2015-04-07 ENCOUNTER — Ambulatory Visit: Payer: Medicare Other | Admitting: Family Medicine

## 2015-04-19 ENCOUNTER — Telehealth: Payer: Self-pay

## 2015-04-19 ENCOUNTER — Telehealth: Payer: Self-pay | Admitting: Internal Medicine

## 2015-04-19 NOTE — Telephone Encounter (Signed)
Patient called and requested a med refill for triamcinolone cream (KENALOG) 0.1 %. Please f/u

## 2015-04-19 NOTE — Telephone Encounter (Signed)
Returned patient phone call Patient not available Left message on voice mail to return our call Patient will need to be seen for refills on medications Has not been seen in office in a year

## 2015-04-26 ENCOUNTER — Encounter (HOSPITAL_COMMUNITY): Payer: Self-pay | Admitting: *Deleted

## 2015-04-26 ENCOUNTER — Emergency Department (HOSPITAL_COMMUNITY): Payer: Medicare HMO

## 2015-04-26 ENCOUNTER — Emergency Department (HOSPITAL_COMMUNITY)
Admission: EM | Admit: 2015-04-26 | Discharge: 2015-04-26 | Disposition: A | Discharge status: Home / Self Care | Payer: Medicare HMO | Attending: Emergency Medicine | Admitting: Emergency Medicine

## 2015-04-26 DIAGNOSIS — M1990 Unspecified osteoarthritis, unspecified site: Secondary | ICD-10-CM | POA: Insufficient documentation

## 2015-04-26 DIAGNOSIS — Z23 Encounter for immunization: Secondary | ICD-10-CM | POA: Insufficient documentation

## 2015-04-26 DIAGNOSIS — Z862 Personal history of diseases of the blood and blood-forming organs and certain disorders involving the immune mechanism: Secondary | ICD-10-CM | POA: Insufficient documentation

## 2015-04-26 DIAGNOSIS — F329 Major depressive disorder, single episode, unspecified: Secondary | ICD-10-CM | POA: Diagnosis not present

## 2015-04-26 DIAGNOSIS — S81021A Laceration with foreign body, right knee, initial encounter: Secondary | ICD-10-CM | POA: Diagnosis not present

## 2015-04-26 DIAGNOSIS — G8929 Other chronic pain: Secondary | ICD-10-CM | POA: Diagnosis not present

## 2015-04-26 DIAGNOSIS — S51831A Puncture wound without foreign body of right forearm, initial encounter: Secondary | ICD-10-CM | POA: Insufficient documentation

## 2015-04-26 DIAGNOSIS — Z87828 Personal history of other (healed) physical injury and trauma: Secondary | ICD-10-CM | POA: Insufficient documentation

## 2015-04-26 DIAGNOSIS — F17210 Nicotine dependence, cigarettes, uncomplicated: Secondary | ICD-10-CM | POA: Insufficient documentation

## 2015-04-26 DIAGNOSIS — M5432 Sciatica, left side: Secondary | ICD-10-CM | POA: Insufficient documentation

## 2015-04-26 DIAGNOSIS — Y92007 Garden or yard of unspecified non-institutional (private) residence as the place of occurrence of the external cause: Secondary | ICD-10-CM | POA: Diagnosis not present

## 2015-04-26 DIAGNOSIS — Y9389 Activity, other specified: Secondary | ICD-10-CM | POA: Insufficient documentation

## 2015-04-26 DIAGNOSIS — Y998 Other external cause status: Secondary | ICD-10-CM | POA: Diagnosis not present

## 2015-04-26 DIAGNOSIS — R21 Rash and other nonspecific skin eruption: Secondary | ICD-10-CM

## 2015-04-26 DIAGNOSIS — W540XXA Bitten by dog, initial encounter: Secondary | ICD-10-CM

## 2015-04-26 DIAGNOSIS — Z7952 Long term (current) use of systemic steroids: Secondary | ICD-10-CM | POA: Insufficient documentation

## 2015-04-26 MED ORDER — RABIES IMMUNE GLOBULIN 150 UNIT/ML IM INJ
20.0000 [IU]/kg | INJECTION | Freq: Once | INTRAMUSCULAR | Status: AC
  Administered 2015-04-26: 1350 [IU] via INTRAMUSCULAR
  Filled 2015-04-26: qty 9

## 2015-04-26 MED ORDER — TETANUS-DIPHTH-ACELL PERTUSSIS 5-2.5-18.5 LF-MCG/0.5 IM SUSP
0.5000 mL | Freq: Once | INTRAMUSCULAR | Status: AC
  Administered 2015-04-26: 0.5 mL via INTRAMUSCULAR
  Filled 2015-04-26: qty 0.5

## 2015-04-26 MED ORDER — TRIAMCINOLONE ACETONIDE 0.1 % EX CREA: 1.0000 "application " | TOPICAL_CREAM | Freq: Two times a day (BID) | CUTANEOUS | Status: DC

## 2015-04-26 MED ORDER — RABIES VACCINE, PCEC IM SUSR
1.0000 mL | Freq: Once | INTRAMUSCULAR | Status: AC
  Administered 2015-04-26: 1 mL via INTRAMUSCULAR
  Filled 2015-04-26: qty 1

## 2015-04-26 MED ORDER — AMOXICILLIN-POT CLAVULANATE 875-125 MG PO TABS: 1.0000 | ORAL_TABLET | Freq: Two times a day (BID) | ORAL | Status: DC

## 2015-04-26 NOTE — ED Provider Notes (Signed)
CSN: [id number]     Arrival date & time 04/26/15  1424 History  By signing my name below, I, Hansel Feinstein, attest that this documentation has been prepared under the direction and in the presence of [employer], PA-C.  Electronically Signed: Hansel Feinstein, ED Scribe. 04/26/2015. 3:59 PM.     Chief Complaint  Patient presents with  . Animal Bite    The history is provided by the patient. No language interpreter was used.    HPI Comments: Shawn Newton is a 59 y.o. male with h/o substance abuse, chronic back pain who presents to the Emergency Department complaining of multiple lacerations and puncture wounds with controlled bleeding to the right forearm and right lower leg s/p dog bite that occurred this morning. He reports that the dog attacked him after he tried to remove the dog from his yard. Pt states the dog is unfamiliar to him and he is unaware of the dogs vaccination status. Pt states associated numbness and paresthesia to his right calf. Bleeding controlled with dressings applied in the ED. Tdap is out of date. He denies focal weakness, fever.   Past Medical History  Diagnosis Date  . Sickle cell trait ([place])   . Chronic back pain   . Substance abuse     cocaine; not used in 1 year  . Head injury 2010    result of falling from tree  . Arthritis   . Depression   . Sciatica     left   Past Surgical History  Procedure Laterality Date  . Tonsillectomy  1970  . Patella fracture surgery  2010  . Hip fracture surgery  2010    after falling from tree  . Colonoscopy  05/25/2011   Family History  Problem Relation Age of Onset  . Colon cancer Neg Hx   . Heart disease Neg Hx   . Stomach cancer Neg Hx   . Diabetes Father   . Diabetes Sister    Social History  Substance Use Topics  . Smoking status: Current Every Day Smoker -- 0.00 packs/day    Types: Cigars  . Smokeless tobacco: Never Used     Comment: smokes 3 cigars daily  . Alcohol Use: 9.0 - 12.0 oz/week   15-20 Cans of beer per week     Comment: social    Review of Systems  Constitutional: Negative for fever.  Skin: Positive for wound.  Neurological: Positive for numbness. Negative for weakness.  All other systems reviewed and are negative.  Allergies  Review of patient's allergies indicates no known allergies.  Home Medications   Prior to Admission medications   Medication Sig Start Date End Date Taking? Authorizing Provider  cyclobenzaprine (FLEXERIL) 10 MG tablet Take 1 tablet (10 mg total) by mouth 2 (two) times daily as needed for muscle spasms. 06/17/13   Nat Christen, MD  HYDROcodone-acetaminophen (NORCO) 5-325 MG per tablet Take 2 tablets by mouth every 4 (four) hours as needed. 06/17/13   Nat Christen, MD  HYDROcodone-acetaminophen (NORCO/VICODIN) 5-325 MG per tablet Take 1 tablet by mouth every 6 (six) hours as needed for moderate pain.    Historical Provider, MD  triamcinolone cream (KENALOG) 0.1 % Apply 1 application topically 2 (two) times daily. dispense 1 pound jar 03/25/14   Lorayne Marek, MD   BP 117/81 mmHg  Pulse 50  Temp(Src) 97.7 F (36.5 C) (Oral)  Resp 18  Ht 5\' 9"  (1.753 m)  Wt 150 lb (68.04 kg)  BMI 22.14  kg/m2  SpO2 100% Physical Exam  Constitutional: He is oriented to person, place, and time. He appears well-developed and well-nourished. No distress.  HENT:  Head: Normocephalic and atraumatic.  Eyes: Conjunctivae are normal. Right eye exhibits no discharge. Left eye exhibits no discharge. No scleral icterus.  Cardiovascular: Normal rate and intact distal pulses.   Pulmonary/Chest: Effort normal.  Neurological: He is alert and oriented to person, place, and time. Coordination normal.  Strength 5/5 throughout. No sensory deficits.    Skin: Skin is warm and dry. No rash noted. He is not diaphoretic. No erythema. No pallor.  1cm laceration to Lateral aspect of RLE below knee. 2, 1cm lacerations to posterior knee.   2 punctate wounds to R forearm. No obvious  bony deformity. No redness, warmth or ecchymosis. No active bleeding.   Psychiatric: He has a normal mood and affect. His behavior is normal.  Nursing note and vitals reviewed.  ED Course  Procedures (including critical care time) DIAGNOSTIC STUDIES: Oxygen Saturation is 100% on RA, normal by my interpretation.    COORDINATION OF CARE: 3:57 PM Discussed treatment plan with pt at bedside which includes XR, rabies vaccinations and Tdap update and pt agreed to plan.  Imaging Review Dg Wrist Complete Right  04/26/2015  CLINICAL DATA:  Dog bite.  Rule out foreign body EXAM: RIGHT WRIST - COMPLETE 3+ VIEW COMPARISON:  None. FINDINGS: Negative for fracture.  No radiopaque foreign body. Chronic fracture deformity at the base of the fifth metacarpal. Mild degenerative changes in the wrist joint with mild joint space narrowing and mild spurring of the distal radius. Early arterial calcification. IMPRESSION: Negative for fracture or foreign body. Electronically Signed   By: Franchot Gallo M.D.   On: 04/26/2015 16:40   Dg Tibia/fibula Right  04/26/2015  CLINICAL DATA:  59 year old male status post dog bite earlier today EXAM: RIGHT TIBIA AND FIBULA - 2 VIEW COMPARISON:  Concurrently obtained radiographs of the right wrist FINDINGS: There is no evidence of fracture or other focal bone lesions. Soft tissues are unremarkable. Atherosclerotic vascular calcifications noted along the course of the runoff arteries. IMPRESSION: Negative. Atherosclerotic vascular calcifications. Electronically Signed   By: Jacqulynn Cadet M.D.   On: 04/26/2015 16:39   I have personally reviewed and evaluated these images as part of my medical decision-making.  MDM   Final diagnoses:  Dog bite   Pt presents with laceration from a dog bite.  Pt wounds irrigated well with 18ga angiocath with sterile saline. Wounds examined with visualization of the base and no foreign bodies seen.  Pt Alert and oriented, NAD, nontoxic,  nonseptic appearing.  Capillary refill intact and pt without neurologic deficit. XR negative for acute findings, no fx.  Patient tetanus updated.  Patient rabies vaccine and immunoglobulin given. Pain treated in the emergency department. Wounds not closed secondary to concern for infection. We'll discharge home with pain medication, Augmentin and requests for close follow-up with PCP or back in the ER. Discussed with pt that he will need to return on day 3, 7 and 14 for additional rabies vaccination. Pt expresses understanding. Return precautions outlined in patient discharge instructions.     I personally performed the services described in this documentation, which was scribed in my presence. The recorded information has been reviewed and is accurate.     Dondra Spry [place], PA-C 04/26/15 1656  Dorie Rank, MD 04/28/15 [phone]

## 2015-04-26 NOTE — ED Notes (Signed)
PT has dog bite to Rt fore arm and Rt lower leg. Pt does not know the dog.

## 2015-04-26 NOTE — Discharge Instructions (Signed)
Return to this department in 3days, followed by 7days and then 14 days for additional rabies immunizations. Take antibiotics as prescribed. Return to the ED if you experience severe increase in your pain, redness or swelling around affected area, fever, chills,

## 2015-04-26 NOTE — ED Notes (Signed)
Declined W/C at D/C and was escorted to lobby by RN. 

## 2015-05-10 ENCOUNTER — Emergency Department (INDEPENDENT_AMBULATORY_CARE_PROVIDER_SITE_OTHER)
Admission: EM | Admit: 2015-05-10 | Discharge: 2015-05-10 | Disposition: A | Discharge status: Home / Self Care | Payer: Medicare HMO | Source: Home / Self Care | Attending: Family Medicine | Admitting: Family Medicine

## 2015-05-10 ENCOUNTER — Encounter (HOSPITAL_COMMUNITY): Payer: Self-pay | Admitting: *Deleted

## 2015-05-10 DIAGNOSIS — Z203 Contact with and (suspected) exposure to rabies: Secondary | ICD-10-CM | POA: Diagnosis not present

## 2015-05-10 DIAGNOSIS — Z23 Encounter for immunization: Secondary | ICD-10-CM

## 2015-05-10 DIAGNOSIS — S41151D Open bite of right upper arm, subsequent encounter: Secondary | ICD-10-CM | POA: Diagnosis not present

## 2015-05-10 DIAGNOSIS — Z2914 Encounter for prophylactic rabies immune globin: Secondary | ICD-10-CM

## 2015-05-10 MED ORDER — RABIES VACCINE, PCEC IM SUSR
INTRAMUSCULAR | Status: AC
Start: 2015-05-10 — End: 2015-05-10
  Filled 2015-05-10: qty 1

## 2015-05-10 MED ORDER — RABIES VACCINE, PCEC IM SUSR
1.0000 mL | Freq: Once | INTRAMUSCULAR | Status: AC
  Administered 2015-05-10: 1 mL via INTRAMUSCULAR

## 2015-05-10 NOTE — ED Notes (Signed)
Pt  Was   Seen  For  Dog  Bite   On  Dec  26  Has  Missed  All  Of his  followup  Vaccines         Due  To  Transportation   And     Or   [place]   Per dr  Artelia Laroche  doage  To  Consider  Today  As  Day  3

## 2015-05-10 NOTE — Discharge Instructions (Signed)
Return on 1/`13 and 1/20 for final 2 rabies shots for your protection.

## 2015-05-10 NOTE — ED Provider Notes (Signed)
CSN: [id number]     Arrival date & time 05/10/15  1549 History   First MD Initiated Contact with Patient 05/10/15 1633     Chief Complaint  Patient presents with  . Rabies Injection   (Consider location/radiation/quality/duration/timing/severity/associated sxs/prior Treatment) Patient is a 59 y.o. male presenting with animal bite. The history is provided by the patient.  Animal Bite Contact animal:  Dog (stray dog injury and seen in ER for rabies series but b/ o transportation diif series never completed. here for vacc.) Location:  Shoulder/arm Shoulder/arm injury location:  R arm and R forearm Time since incident:  11 days   Past Medical History  Diagnosis Date  . Sickle cell trait ([place])   . Chronic back pain   . Substance abuse     cocaine; not used in 1 year  . Head injury 2010    result of falling from tree  . Arthritis   . Depression   . Sciatica     left   Past Surgical History  Procedure Laterality Date  . Tonsillectomy  1970  . Patella fracture surgery  2010  . Hip fracture surgery  2010    after falling from tree  . Colonoscopy  05/25/2011   Family History  Problem Relation Age of Onset  . Colon cancer Neg Hx   . Heart disease Neg Hx   . Stomach cancer Neg Hx   . Diabetes Father   . Diabetes Sister    Social History  Substance Use Topics  . Smoking status: Current Every Day Smoker -- 0.00 packs/day    Types: Cigars  . Smokeless tobacco: Never Used     Comment: smokes 3 cigars daily  . Alcohol Use: 9.0 - 12.0 oz/week    15-20 Cans of beer per week     Comment: social    Review of Systems  Constitutional: Negative.   Skin: Positive for wound.  All other systems reviewed and are negative.   Allergies  Review of patient's allergies indicates no known allergies.  Home Medications   Prior to Admission medications   Medication Sig Start Date End Date Taking? Authorizing Provider  amoxicillin-clavulanate (AUGMENTIN) 875-125 MG tablet Take 1 tablet  by mouth every 12 (twelve) hours. 04/26/15   Samantha Tripp Dowless, PA-C  cyclobenzaprine (FLEXERIL) 10 MG tablet Take 1 tablet (10 mg total) by mouth 2 (two) times daily as needed for muscle spasms. 06/17/13   Nat Christen, MD  HYDROcodone-acetaminophen (NORCO) 5-325 MG per tablet Take 2 tablets by mouth every 4 (four) hours as needed. 06/17/13   Nat Christen, MD  HYDROcodone-acetaminophen (NORCO/VICODIN) 5-325 MG per tablet Take 1 tablet by mouth every 6 (six) hours as needed for moderate pain.    Historical Provider, MD  triamcinolone cream (KENALOG) 0.1 % Apply 1 application topically 2 (two) times daily. dispense 1 pound jar 04/26/15   Samantha Tripp Dowless, PA-C   Meds Ordered and Administered this Visit   Medications  rabies vaccine (RABAVERT) injection 1 mL (1 mL Intramuscular Given 05/10/15 1700)    BP 120/74 mmHg  Pulse 70  Temp(Src) 98 F (36.7 C) (Oral)  Resp 16  SpO2 99% No data found.   Physical Exam  Constitutional: He is oriented to person, place, and time. He appears well-developed and well-nourished.  Neurological: He is alert and oriented to person, place, and time.  Skin: Skin is warm and dry.  Dog bite wounds healed well.  Nursing note and vitals reviewed.   ED Course  Procedures (including critical care time)  Labs Review Labs Reviewed - No data to display  Imaging Review No results found.   Visual Acuity Review  Right Eye Distance:   Left Eye Distance:   Bilateral Distance:    Right Eye Near:   Left Eye Near:    Bilateral Near:         MDM   1. Need for rabies vaccination        Shawn Fischer, MD 05/10/15 1729

## 2015-05-10 NOTE — ED Notes (Signed)
Plan of  Care  Discussed  With  Pt     Who  Knows    When to  Return       On  The  New    Rabies   Schedule

## 2015-05-14 ENCOUNTER — Encounter (HOSPITAL_COMMUNITY): Payer: Self-pay | Admitting: Emergency Medicine

## 2015-05-14 ENCOUNTER — Emergency Department (INDEPENDENT_AMBULATORY_CARE_PROVIDER_SITE_OTHER)
Admission: EM | Admit: 2015-05-14 | Discharge: 2015-05-14 | Disposition: A | Discharge status: Home / Self Care | Payer: Medicare HMO | Source: Home / Self Care

## 2015-05-14 DIAGNOSIS — Z203 Contact with and (suspected) exposure to rabies: Secondary | ICD-10-CM

## 2015-05-14 DIAGNOSIS — Z23 Encounter for immunization: Secondary | ICD-10-CM | POA: Diagnosis not present

## 2015-05-14 MED ORDER — RABIES VACCINE, PCEC IM SUSR
INTRAMUSCULAR | Status: AC
  Filled 2015-05-14: qty 1

## 2015-05-14 MED ORDER — RABIES VACCINE, PCEC IM SUSR
1.0000 mL | Freq: Once | INTRAMUSCULAR | Status: AC
  Administered 2015-05-14: 1 mL via INTRAMUSCULAR

## 2015-05-14 NOTE — Discharge Instructions (Signed)
Return on 05/17/15 for day 7   Thank you

## 2015-05-14 NOTE — ED Notes (Signed)
Here for day 3 rabies Voices no new concerns A&O x4... No acute distress.

## 2015-05-24 ENCOUNTER — Emergency Department (INDEPENDENT_AMBULATORY_CARE_PROVIDER_SITE_OTHER)
Admission: EM | Admit: 2015-05-24 | Discharge: 2015-05-24 | Disposition: A | Discharge status: Home / Self Care | Payer: Medicare HMO | Source: Home / Self Care | Attending: Emergency Medicine | Admitting: Emergency Medicine

## 2015-05-24 ENCOUNTER — Emergency Department (INDEPENDENT_AMBULATORY_CARE_PROVIDER_SITE_OTHER): Payer: Medicare HMO

## 2015-05-24 ENCOUNTER — Encounter (HOSPITAL_COMMUNITY): Payer: Self-pay | Admitting: Emergency Medicine

## 2015-05-24 DIAGNOSIS — M25552 Pain in left hip: Secondary | ICD-10-CM

## 2015-05-24 DIAGNOSIS — M25559 Pain in unspecified hip: Secondary | ICD-10-CM

## 2015-05-24 DIAGNOSIS — M5432 Sciatica, left side: Secondary | ICD-10-CM

## 2015-05-24 MED ORDER — TRAMADOL HCL 50 MG PO TABS: ORAL_TABLET | ORAL | Status: DC

## 2015-05-24 MED ORDER — RABIES VACCINE, PCEC IM SUSR
1.0000 mL | Freq: Once | INTRAMUSCULAR | Status: AC
  Administered 2015-05-24: 1 mL via INTRAMUSCULAR

## 2015-05-24 MED ORDER — RABIES VACCINE, PCEC IM SUSR
INTRAMUSCULAR | Status: AC
  Filled 2015-05-24: qty 1

## 2015-05-24 MED ORDER — PREDNISONE 10 MG (21) PO TBPK: ORAL_TABLET | ORAL | Status: DC

## 2015-05-24 MED ORDER — DICLOFENAC SODIUM 75 MG PO TBEC: 75.0000 mg | DELAYED_RELEASE_TABLET | Freq: Two times a day (BID) | ORAL | Status: DC

## 2015-05-24 NOTE — ED Provider Notes (Addendum)
HPI  SUBJECTIVE:  Shawn Newton is a 60 y.o. male who presents with shooting left hip and low back pain for the past 2-3 weeks status post fall. Patient states that a dog bit him on his right lower extremity and he fell onto his left hip, back. Since then reports intensifying of his baseline sciatic pain. Symptoms are better with standing, walking, worse with lying on a back and sitting. He has not tried any medications for this. He states that his hip feels unstable, but denies subluxation or dislocation. He reports numbness and tingling in his left lateral foot posterior leg, but states that this has not changed. No nausea, vomiting, fevers, erythema, bruising. No lifting. No abdominal pain, urinary complaints. No saddle anesthesia, weakness. No urinary, fecal incontinence.No saddle anesthesia, bilateral radicular leg pain/weakness, fevers, neurological deficits,   h/o CA / multiple myleoma,  pain worse at night,  h/o prolonged steroid use, h/o osteopenia, h/o IVDU, h/o HIV, recent bacteremia, known AAA.  Past medical history negative for diabetes, hypertension. Past medical history of degenerative disc disease, chronic back/leg pain, sciatica, ORIF left hip.  Past Medical History  Diagnosis Date  . Sickle cell trait ([place])   . Chronic back pain   . Substance abuse     cocaine; not used in 1 year  . Head injury 2010    result of falling from tree  . Arthritis   . Depression   . Sciatica     left    Past Surgical History  Procedure Laterality Date  . Tonsillectomy  1970  . Patella fracture surgery  2010  . Hip fracture surgery  2010    after falling from tree  . Colonoscopy  05/25/2011    Family History  Problem Relation Age of Onset  . Colon cancer Neg Hx   . Heart disease Neg Hx   . Stomach cancer Neg Hx   . Diabetes Father   . Diabetes Sister     Social History  Substance Use Topics  . Smoking status: Current Every Day Smoker -- 0.00 packs/day    Types: Cigars  .  Smokeless tobacco: Never Used     Comment: smokes 3 cigars daily  . Alcohol Use: 9.0 - 12.0 oz/week    15-20 Cans of beer per week     Comment: social    No current facility-administered medications for this encounter.  Current outpatient prescriptions:  .  amoxicillin-clavulanate (AUGMENTIN) 875-125 MG tablet, Take 1 tablet by mouth every 12 (twelve) hours., Disp: 14 tablet, Rfl: 0 .  cyclobenzaprine (FLEXERIL) 10 MG tablet, Take 1 tablet (10 mg total) by mouth 2 (two) times daily as needed for muscle spasms., Disp: 20 tablet, Rfl: 0 .  HYDROcodone-acetaminophen (NORCO) 5-325 MG per tablet, Take 2 tablets by mouth every 4 (four) hours as needed., Disp: 20 tablet, Rfl: 0 .  HYDROcodone-acetaminophen (NORCO/VICODIN) 5-325 MG per tablet, Take 1 tablet by mouth every 6 (six) hours as needed for moderate pain., Disp: , Rfl:  .  triamcinolone cream (KENALOG) 0.1 %, Apply 1 application topically 2 (two) times daily. dispense 1 pound jar, Disp: 30 g, Rfl: 2  No Known Allergies   ROS  As noted in HPI.   Physical Exam  BP 115/77 mmHg  Pulse 56  Temp(Src) 97.9 F (36.6 C) (Oral)  Resp 12  SpO2 100%  Constitutional: Well developed, well nourished, no acute distress Eyes:  EOMI, conjunctiva normal bilaterally HENT: Normocephalic, atraumatic,mucus membranes moist Respiratory: Normal inspiratory effort  Cardiovascular: Normal rate GI: nondistended. No suprapubic tenderness skin: No rash, skin intact Musculoskeletal:  Back: + no CVAT. - paralumbar tenderness,  - muscle spasm. No bony tenderness L-spine, sacrum, SI joint. baseline ROM intact with intact PT pulses. + SLR L side. Sensation baseline light touch bilaterally for Pt, DTR's symmetric and intact bilaterally KJ, Motor symmetric bilateral 5/5 hip flexion, quadriceps, hamstrings, EHL, foot dorsiflexion, foot plantarflexion, gait somewhat antalgic but without apparent new ataxia. [id number] along the lateral and posterior hip. No  tenderness down IT band, quadriceps. No pain with passive abduction/adduction of leg. No pain/pain with int/ext rotation hip. + tenderness at sciatic notch. Roll test for muscle spasm negative. Flexion/extension knee WNL. Knee joint NT. Motor strength flexion/ext hip 5/5. Sensation to LT intact. DP 2  Neurologic: Alert & oriented x 3, no focal neuro deficits Psychiatric: Speech and behavior appropriate   ED Course   Medications  rabies vaccine (RABAVERT) injection 1 mL (1 mL Intramuscular Given 05/24/15 1743)    Orders Placed This Encounter  Procedures  . DG Hip Unilat With Pelvis 2-3 Views Left    H/o hip replacement, sensation instability, tenderness posterior and lateral hip s/p fall 2-3 weeks ago. R/o acute changes.    Standing Status: Standing     Number of Occurrences: 1     Standing Expiration Date:     Order Specific Question:  Symptom/Reason for Exam    Answer:  Hip pain [id number]  . DG Lumbar Spine Complete    Standing Status: Standing     Number of Occurrences: 1     Standing Expiration Date:     Order Specific Question:  Reason for Exam (SYMPTOM  OR DIAGNOSIS REQUIRED)    Answer:  sciatic pain s/p fall. no bony tenderness. H/o DDD. r/o acute changes    No results found for this or any previous visit (from the past 24 hour(s)). Dg Lumbar Spine Complete  05/24/2015  CLINICAL DATA:  Back pain.  No known injury. EXAM: LUMBAR SPINE - COMPLETE 4+ VIEW COMPARISON:  05/23/2013 FINDINGS: There is no evidence of lumbar spine fracture. There is straightening of the cervical lordosis. There are multilevel osteoarthritic changes with disc space narrowing, endplate sclerosis, and osteophytosis. There is milder posterior facet arthropathy. Bulky bridging right-sided osteophytes are seen at L2-L3, L3-L4. IMPRESSION: Multilevel spondylosis, which may represent osteoarthritic changes or inflammatory arthropathy. Electronically Signed   By: Fidela Salisbury M.D.   On: 05/24/2015 19:23   Dg  Hip Unilat With Pelvis 2-3 Views Left  05/24/2015  CLINICAL DATA:  Pt here with Lt hip pain, hx of injury to lt hip 5-6 years ago, pt had screws put in, pt had fell out of a tree, today no known injury , pain EXAM: DG HIP (WITH OR WITHOUT PELVIS) 2-3V LEFT COMPARISON:  None. FINDINGS: Patient has had previous ORIF of the acetabulum. There is sclerosis of the femoral head. No acute fracture or subluxation. Degenerative changes are noted in the lower lumbar spine. IMPRESSION: No evidence for acute  abnormality. Electronically Signed   By: Nolon Nations M.D.   On: 05/24/2015 19:19    ED Clinical Impression  Hip pain - Plan: DG Hip Unilat With Pelvis 2-3 Views Left, DG Hip Unilat With Pelvis 2-3 Views Left  ED Assessment/Plan  [place] narcotic database reviewed. Pt with no narcotic rx in the past 6 months.   No urinary complaints. No evidence of spinal cord involvement based on H&P. We'll check x-rays of back and hip  given recent trauma to the area to rule out any acute changes.  Reviewed imaging independently. Multilevel osteoarthritic changes in back, No acute changes in the back or hip per radiology. See radiology report for full details.  Pt ambulatory in the UC. Home with NSAID, tramadol, prednisone Dosepak. Pt to f/u with his orthopedic surgeon, Dr. Annamaria Boots,  plus or minus physical therapy if not getting better with this.  Discussed imaging, medical decision-making, and plan for follow-up with the patient.  Discussed signs and symptoms that should prompt return to the emergency department.  Patient agrees with plan.    *This clinic note was created using Dragon dictation software. Therefore, there may be occasional mistakes despite careful proofreading.  ?    Melynda Ripple, MD 05/25/15 [place], MD 05/25/15 1120

## 2015-05-24 NOTE — Discharge Instructions (Signed)
Follow-up with the orthopedic surgeon if not getting better in 10 days. Take the diclofenac twice a day. Take it with 1 g of Tylenol. He may take tramadol as needed for severe pain. The prednisone will help with the swelling and with the sciatic nerve. Go to the ER if you accidentally urinate or defecate on yourself, for pain not controlled with medications, or other concerns

## 2015-05-24 NOTE — ED Notes (Signed)
The patient presented for his 4th rabies injection and also requested to see the provider for left hip, leg and knee pain that has been ongoing for 3 weeks. The patient stated that he does have orthopedic hardware from previous surgeries.

## 2015-08-31 ENCOUNTER — Emergency Department (HOSPITAL_COMMUNITY): Payer: No Typology Code available for payment source

## 2015-08-31 ENCOUNTER — Encounter (HOSPITAL_COMMUNITY): Payer: Self-pay

## 2015-08-31 ENCOUNTER — Emergency Department (HOSPITAL_COMMUNITY)
Admission: EM | Admit: 2015-08-31 | Discharge: 2015-08-31 | Disposition: A | Discharge status: Home / Self Care | Payer: No Typology Code available for payment source | Attending: Emergency Medicine | Admitting: Emergency Medicine

## 2015-08-31 DIAGNOSIS — Z79891 Long term (current) use of opiate analgesic: Secondary | ICD-10-CM | POA: Insufficient documentation

## 2015-08-31 DIAGNOSIS — F329 Major depressive disorder, single episode, unspecified: Secondary | ICD-10-CM | POA: Insufficient documentation

## 2015-08-31 DIAGNOSIS — Z791 Long term (current) use of non-steroidal anti-inflammatories (NSAID): Secondary | ICD-10-CM | POA: Diagnosis not present

## 2015-08-31 DIAGNOSIS — Z7952 Long term (current) use of systemic steroids: Secondary | ICD-10-CM | POA: Diagnosis not present

## 2015-08-31 DIAGNOSIS — S0181XA Laceration without foreign body of other part of head, initial encounter: Secondary | ICD-10-CM | POA: Diagnosis present

## 2015-08-31 DIAGNOSIS — Y999 Unspecified external cause status: Secondary | ICD-10-CM | POA: Diagnosis not present

## 2015-08-31 DIAGNOSIS — S0191XA Laceration without foreign body of unspecified part of head, initial encounter: Secondary | ICD-10-CM

## 2015-08-31 DIAGNOSIS — Y92410 Unspecified street and highway as the place of occurrence of the external cause: Secondary | ICD-10-CM | POA: Insufficient documentation

## 2015-08-31 DIAGNOSIS — F17290 Nicotine dependence, other tobacco product, uncomplicated: Secondary | ICD-10-CM | POA: Insufficient documentation

## 2015-08-31 DIAGNOSIS — Y939 Activity, unspecified: Secondary | ICD-10-CM | POA: Insufficient documentation

## 2015-08-31 DIAGNOSIS — M1990 Unspecified osteoarthritis, unspecified site: Secondary | ICD-10-CM | POA: Insufficient documentation

## 2015-08-31 DIAGNOSIS — R51 Headache: Secondary | ICD-10-CM | POA: Insufficient documentation

## 2015-08-31 DIAGNOSIS — Z79899 Other long term (current) drug therapy: Secondary | ICD-10-CM | POA: Diagnosis not present

## 2015-08-31 MED ORDER — NAPROXEN 500 MG PO TABS: 500.0000 mg | ORAL_TABLET | Freq: Two times a day (BID) | ORAL | Status: DC

## 2015-08-31 MED ORDER — IBUPROFEN 200 MG PO TABS
600.0000 mg | ORAL_TABLET | Freq: Once | ORAL | Status: AC
  Administered 2015-08-31: 600 mg via ORAL
  Filled 2015-08-31: qty 3

## 2015-08-31 MED ORDER — LIDOCAINE-EPINEPHRINE (PF) 2 %-1:200000 IJ SOLN
10.0000 mL | Freq: Once | INTRAMUSCULAR | Status: AC
  Administered 2015-08-31: 10 mL
  Filled 2015-08-31 (×2): qty 20

## 2015-08-31 MED ORDER — CYCLOBENZAPRINE HCL 10 MG PO TABS: 10.0000 mg | ORAL_TABLET | Freq: Two times a day (BID) | ORAL | Status: DC | PRN
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
## 2015-09-15 ENCOUNTER — Encounter (HOSPITAL_COMMUNITY): Payer: Self-pay | Admitting: *Deleted

## 2015-09-15 ENCOUNTER — Ambulatory Visit (HOSPITAL_COMMUNITY)
Admission: EM | Admit: 2015-09-15 | Discharge: 2015-09-15 | Disposition: A | Discharge status: Home / Self Care | Payer: No Typology Code available for payment source | Attending: Family Medicine | Admitting: Family Medicine

## 2015-09-15 DIAGNOSIS — Z4802 Encounter for removal of sutures: Secondary | ICD-10-CM

## 2015-09-15 MED ORDER — NAPROXEN 500 MG PO TABS: 500.0000 mg | ORAL_TABLET | Freq: Two times a day (BID) | ORAL | Status: DC

## 2015-09-15 MED ORDER — CYCLOBENZAPRINE HCL 10 MG PO TABS: 10.0000 mg | ORAL_TABLET | Freq: Two times a day (BID) | ORAL | Status: DC | PRN

## 2015-09-15 MED ORDER — MONTELUKAST SODIUM 10 MG PO TABS: 10.0000 mg | ORAL_TABLET | Freq: Every day | ORAL | Status: DC
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
## 2015-09-28 ENCOUNTER — Encounter (HOSPITAL_COMMUNITY): Payer: Self-pay | Admitting: *Deleted

## 2015-09-28 ENCOUNTER — Emergency Department (HOSPITAL_COMMUNITY)
Admission: EM | Admit: 2015-09-28 | Discharge: 2015-09-28 | Disposition: A | Discharge status: Home / Self Care | Payer: Medicare HMO | Attending: Emergency Medicine | Admitting: Emergency Medicine

## 2015-09-28 DIAGNOSIS — Z87828 Personal history of other (healed) physical injury and trauma: Secondary | ICD-10-CM | POA: Insufficient documentation

## 2015-09-28 DIAGNOSIS — G8929 Other chronic pain: Secondary | ICD-10-CM | POA: Diagnosis not present

## 2015-09-28 DIAGNOSIS — F329 Major depressive disorder, single episode, unspecified: Secondary | ICD-10-CM | POA: Insufficient documentation

## 2015-09-28 DIAGNOSIS — R200 Anesthesia of skin: Secondary | ICD-10-CM | POA: Diagnosis not present

## 2015-09-28 DIAGNOSIS — Z791 Long term (current) use of non-steroidal anti-inflammatories (NSAID): Secondary | ICD-10-CM | POA: Diagnosis not present

## 2015-09-28 DIAGNOSIS — Z79899 Other long term (current) drug therapy: Secondary | ICD-10-CM | POA: Diagnosis not present

## 2015-09-28 DIAGNOSIS — M1990 Unspecified osteoarthritis, unspecified site: Secondary | ICD-10-CM | POA: Diagnosis not present

## 2015-09-28 DIAGNOSIS — Z862 Personal history of diseases of the blood and blood-forming organs and certain disorders involving the immune mechanism: Secondary | ICD-10-CM | POA: Diagnosis not present

## 2015-09-28 DIAGNOSIS — F17210 Nicotine dependence, cigarettes, uncomplicated: Secondary | ICD-10-CM | POA: Insufficient documentation

## 2015-09-28 NOTE — ED Notes (Signed)
PA in with pt 
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
## 2016-03-01 ENCOUNTER — Encounter (INDEPENDENT_AMBULATORY_CARE_PROVIDER_SITE_OTHER): Payer: Self-pay | Admitting: Orthopaedic Surgery

## 2016-03-01 ENCOUNTER — Ambulatory Visit (INDEPENDENT_AMBULATORY_CARE_PROVIDER_SITE_OTHER): Payer: Medicare Other | Admitting: Orthopaedic Surgery

## 2016-03-01 ENCOUNTER — Telehealth (INDEPENDENT_AMBULATORY_CARE_PROVIDER_SITE_OTHER): Payer: Self-pay | Admitting: Orthopaedic Surgery

## 2016-03-01 VITALS — BP 111/72 | Temp 96.8°F | Ht 69.0 in | Wt 155.0 lb

## 2016-03-01 DIAGNOSIS — M545 Low back pain, unspecified: Secondary | ICD-10-CM
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
## 2016-03-01 NOTE — Telephone Encounter (Signed)
Patient called advised he saw Dr Lorin Mercy today and his back is still hurting. Patient said when he lay down it feels like something is sticking in his back. Patient said it feels like its in between a BB and a marble. Patient asked what can he do about the pain.   The number to contact him is [phone]

## 2016-03-02 NOTE — Telephone Encounter (Signed)
Please advise 

## 2016-03-02 NOTE — Telephone Encounter (Signed)
Try heating pad , or ibuprofen. ucall thanks

## 2016-03-03 NOTE — Telephone Encounter (Signed)
I called patient and advised. 
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
## 2017-03-02 ENCOUNTER — Ambulatory Visit (INDEPENDENT_AMBULATORY_CARE_PROVIDER_SITE_OTHER): Payer: Medicare Other | Admitting: Orthopaedic Surgery

## 2018-02-20 ENCOUNTER — Ambulatory Visit (INDEPENDENT_AMBULATORY_CARE_PROVIDER_SITE_OTHER): Payer: Medicare Other | Admitting: Surgery

## 2019-06-17 ENCOUNTER — Encounter (HOSPITAL_BASED_OUTPATIENT_CLINIC_OR_DEPARTMENT_OTHER): Payer: Self-pay | Admitting: General Surgery

## 2019-06-17 ENCOUNTER — Ambulatory Visit: Payer: Self-pay | Admitting: General Surgery

## 2019-06-20 ENCOUNTER — Inpatient Hospital Stay (HOSPITAL_COMMUNITY): Admission: RE | Admit: 2019-06-20 | Payer: Medicare HMO | Source: Ambulatory Visit

## 2019-06-20 NOTE — Progress Notes (Signed)
Spoke with patient by phone unable to make covid appointment today, patient will need to reschedule covid test, patient to call transportation and call us back.

## 2019-06-23 NOTE — Progress Notes (Signed)
Spoke  With [employer] traiage at [facility] and let her know patient has not had covid test for surgery tomorrow. Kennyth Lose to speak with dr Kieth Brightly and call me back.

## 2019-06-23 NOTE — Progress Notes (Signed)
Spoke with [facility]at [facility] traige and made aware patient has not had covid test for 06-24-2019 surgery and has not called me back to confirm he can get test today due to transportation issues.

## 2019-06-23 NOTE — Progress Notes (Signed)
Spoke with patient by phone and has not heard from transportation yet. Patient to call me back.

## 2019-06-23 NOTE — Progress Notes (Signed)
Spoke with patient by phone and patient stated he would go get covid test and call transportation now and call back to do history

## 2019-06-24 ENCOUNTER — Ambulatory Visit (HOSPITAL_BASED_OUTPATIENT_CLINIC_OR_DEPARTMENT_OTHER): Admission: RE | Admit: 2019-06-24 | Payer: Medicare HMO | Source: Home / Self Care | Admitting: General Surgery

## 2019-06-24 ENCOUNTER — Encounter (HOSPITAL_BASED_OUTPATIENT_CLINIC_OR_DEPARTMENT_OTHER): Admission: RE | Payer: Self-pay | Source: Home / Self Care

## 2019-06-24 HISTORY — DX: Personal history of colonic polyps: Z86.010

## 2019-06-24 HISTORY — DX: Personal history of colon polyps, unspecified: Z86.0100

## 2019-06-24 HISTORY — DX: Unspecified strabismus: H50.9

## 2019-06-24 HISTORY — DX: Complete loss of teeth, unspecified cause, unspecified class: Z97.2

## 2019-06-24 HISTORY — DX: Bilateral inguinal hernia, without obstruction or gangrene, not specified as recurrent: K40.20

## 2019-06-24 HISTORY — DX: Personal history of other (healed) physical injury and trauma: Z87.828

## 2019-06-24 HISTORY — DX: Presence of dental prosthetic device (complete) (partial): K08.109

## 2019-06-24 HISTORY — DX: Other psychoactive substance abuse, uncomplicated: F19.10

## 2019-06-24 SURGERY — REPAIR, HERNIA, INGUINAL, BILATERAL, LAPAROSCOPIC
Anesthesia: General | Laterality: Bilateral

## 2019-07-31 ENCOUNTER — Ambulatory Visit: Payer: Medicare Other

## 2019-08-01 ENCOUNTER — Ambulatory Visit: Payer: Medicare Other

## 2019-08-02 ENCOUNTER — Ambulatory Visit: Payer: Medicare Other | Attending: Internal Medicine

## 2019-08-02 DIAGNOSIS — Z23 Encounter for immunization: Secondary | ICD-10-CM

## 2019-08-02 NOTE — Progress Notes (Signed)
   Covid-19 Vaccination Clinic  Name:  Shawn Newton    MRN: [id number] DOB: 01-Apr-1956  08/02/2019  [name] was observed post Covid-19 immunization for 15 minutes without incident. He was provided with Vaccine Information Sheet and instruction to access the V-Safe system.   [name] was instructed to call 911 with any severe reactions post vaccine: Marland Kitchen Difficulty breathing  . Swelling of face and throat  . A fast heartbeat  . A bad rash all over body  . Dizziness and weakness   Immunizations Administered    Name Date Dose VIS Date Route   Pfizer COVID-19 Vaccine 08/02/2019  8:12 AM 0.3 mL 04/11/2019 Intramuscular   Manufacturer: [employer], [employer]   Lot: [id number]   [place]: [id number]

## 2019-08-26 ENCOUNTER — Ambulatory Visit: Payer: Medicare Other

## 2019-09-02 ENCOUNTER — Ambulatory Visit: Payer: Self-pay | Admitting: General Surgery

## 2019-09-30 ENCOUNTER — Other Ambulatory Visit (HOSPITAL_COMMUNITY): Payer: Medicare Other

## 2019-10-01 ENCOUNTER — Other Ambulatory Visit (HOSPITAL_COMMUNITY): Payer: Medicare (Managed Care)

## 2019-10-01 ENCOUNTER — Encounter (HOSPITAL_BASED_OUTPATIENT_CLINIC_OR_DEPARTMENT_OTHER): Payer: Self-pay | Admitting: General Surgery

## 2019-10-01 ENCOUNTER — Other Ambulatory Visit (HOSPITAL_COMMUNITY)
Admission: RE | Admit: 2019-10-01 | Discharge: 2019-10-01 | Disposition: A | Discharge status: Home / Self Care | Payer: Medicare (Managed Care) | Source: Ambulatory Visit | Attending: General Surgery | Admitting: General Surgery

## 2019-10-01 ENCOUNTER — Other Ambulatory Visit: Payer: Self-pay

## 2019-10-01 DIAGNOSIS — Z20822 Contact with and (suspected) exposure to covid-19: Secondary | ICD-10-CM | POA: Insufficient documentation

## 2019-10-01 DIAGNOSIS — Z01812 Encounter for preprocedural laboratory examination: Secondary | ICD-10-CM | POA: Insufficient documentation

## 2019-10-01 LAB — SARS CORONAVIRUS 2 (TAT 6-24 HRS): SARS Coronavirus 2: NEGATIVE

## 2019-10-01 NOTE — Progress Notes (Addendum)
Addendum: spoke with [place] zanetto pa and anesthesia md to assess if urine drug screen needed prior to surgery on 10-02-2019.  Spoke w/ via phone for pre-op interview---patient Lab needs dos----     none         COVID test ------10-01-2019 930 am Arrive at -------900 am 10-02-2019 NPO after ------midnight Medications to take morning of surgery -----none Diabetic medication -----n/a Patient Special Instructions -----none Pre-Op special Istructions -----none Patient verbalized understanding of instructions that were given at this phone interview. Patient denies shortness of breath, chest pain, fever, cough a this phone interview.

## 2019-10-01 NOTE — Progress Notes (Signed)
Spoke with patient by phone patient on way to get covid test now

## 2019-10-02 ENCOUNTER — Ambulatory Visit (HOSPITAL_BASED_OUTPATIENT_CLINIC_OR_DEPARTMENT_OTHER): Payer: Medicare (Managed Care) | Admitting: Certified Registered"

## 2019-10-02 ENCOUNTER — Other Ambulatory Visit: Payer: Self-pay

## 2019-10-02 ENCOUNTER — Ambulatory Visit (HOSPITAL_BASED_OUTPATIENT_CLINIC_OR_DEPARTMENT_OTHER)
Admission: RE | Admit: 2019-10-02 | Discharge: 2019-10-02 | Disposition: A | Discharge status: Home / Self Care | Payer: Medicare (Managed Care) | Attending: General Surgery | Admitting: General Surgery

## 2019-10-02 ENCOUNTER — Encounter (HOSPITAL_BASED_OUTPATIENT_CLINIC_OR_DEPARTMENT_OTHER)
Admission: RE | Disposition: A | Discharge status: Home / Self Care | Payer: Self-pay | Source: Home / Self Care | Attending: General Surgery

## 2019-10-02 ENCOUNTER — Encounter (HOSPITAL_BASED_OUTPATIENT_CLINIC_OR_DEPARTMENT_OTHER): Payer: Self-pay | Admitting: General Surgery

## 2019-10-02 DIAGNOSIS — M1990 Unspecified osteoarthritis, unspecified site: Secondary | ICD-10-CM | POA: Diagnosis not present

## 2019-10-02 DIAGNOSIS — F17290 Nicotine dependence, other tobacco product, uncomplicated: Secondary | ICD-10-CM | POA: Diagnosis not present

## 2019-10-02 DIAGNOSIS — Z791 Long term (current) use of non-steroidal anti-inflammatories (NSAID): Secondary | ICD-10-CM | POA: Insufficient documentation

## 2019-10-02 DIAGNOSIS — D573 Sickle-cell trait: Secondary | ICD-10-CM | POA: Diagnosis not present

## 2019-10-02 DIAGNOSIS — K4020 Bilateral inguinal hernia, without obstruction or gangrene, not specified as recurrent: Secondary | ICD-10-CM | POA: Insufficient documentation

## 2019-10-02 HISTORY — PX: INGUINAL HERNIA REPAIR: SHX194

## 2019-10-02 SURGERY — REPAIR, HERNIA, INGUINAL, BILATERAL, LAPAROSCOPIC
Anesthesia: General | Site: Abdomen | Laterality: Bilateral

## 2019-10-02 MED ORDER — LACTATED RINGERS IV SOLN
INTRAVENOUS | Status: DC
  Administered 2019-10-02: 1000 mL via INTRAVENOUS

## 2019-10-02 MED ORDER — FENTANYL CITRATE (PF) 100 MCG/2ML IJ SOLN: 25.0000 ug | INTRAMUSCULAR | Status: DC | PRN

## 2019-10-02 MED ORDER — ONDANSETRON HCL 4 MG/2ML IJ SOLN
INTRAMUSCULAR | Status: DC | PRN
  Administered 2019-10-02: 4 mg via INTRAVENOUS

## 2019-10-02 MED ORDER — BUPIVACAINE LIPOSOME 1.3 % IJ SUSP: 20.0000 mL | Freq: Once | INTRAMUSCULAR | Status: DC

## 2019-10-02 MED ORDER — FENTANYL CITRATE (PF) 250 MCG/5ML IJ SOLN
INTRAMUSCULAR | Status: AC
  Filled 2019-10-02: qty 5

## 2019-10-02 MED ORDER — IBUPROFEN 800 MG PO TABS
800.0000 mg | ORAL_TABLET | Freq: Three times a day (TID) | ORAL | 0 refills | Status: AC | PRN
Start: 2019-10-02 — End: ?

## 2019-10-02 MED ORDER — GABAPENTIN 300 MG PO CAPS
ORAL_CAPSULE | ORAL | Status: AC
  Filled 2019-10-02: qty 1

## 2019-10-02 MED ORDER — ACETAMINOPHEN 325 MG PO TABS: 325.0000 mg | ORAL_TABLET | ORAL | Status: DC | PRN

## 2019-10-02 MED ORDER — LIDOCAINE 2% (20 MG/ML) 5 ML SYRINGE
INTRAMUSCULAR | Status: DC | PRN
  Administered 2019-10-02: 80 mg via INTRAVENOUS

## 2019-10-02 MED ORDER — ACETAMINOPHEN 500 MG PO TABS
ORAL_TABLET | ORAL | Status: AC
  Filled 2019-10-02: qty 2

## 2019-10-02 MED ORDER — BUPIVACAINE HCL 0.5 % IJ SOLN
INTRAMUSCULAR | Status: DC | PRN
  Administered 2019-10-02: 20 mL

## 2019-10-02 MED ORDER — MIDAZOLAM HCL 2 MG/2ML IJ SOLN
INTRAMUSCULAR | Status: AC
  Filled 2019-10-02: qty 2

## 2019-10-02 MED ORDER — DEXAMETHASONE SODIUM PHOSPHATE 10 MG/ML IJ SOLN
INTRAMUSCULAR | Status: DC | PRN
  Administered 2019-10-02: 10 mg via INTRAVENOUS

## 2019-10-02 MED ORDER — OXYCODONE HCL 5 MG PO TABS
5.0000 mg | ORAL_TABLET | Freq: Four times a day (QID) | ORAL | 0 refills | Status: AC | PRN
Start: 2019-10-02 — End: ?

## 2019-10-02 MED ORDER — PROPOFOL 10 MG/ML IV BOLUS
INTRAVENOUS | Status: AC
  Filled 2019-10-02: qty 20

## 2019-10-02 MED ORDER — PROPOFOL 10 MG/ML IV BOLUS
INTRAVENOUS | Status: DC | PRN
  Administered 2019-10-02: 160 mg via INTRAVENOUS

## 2019-10-02 MED ORDER — MIDAZOLAM HCL 5 MG/5ML IJ SOLN
INTRAMUSCULAR | Status: DC | PRN
  Administered 2019-10-02: 2 mg via INTRAVENOUS

## 2019-10-02 MED ORDER — ACETAMINOPHEN 500 MG PO TABS
1000.0000 mg | ORAL_TABLET | ORAL | Status: AC
  Administered 2019-10-02: 1000 mg via ORAL

## 2019-10-02 MED ORDER — SUGAMMADEX SODIUM 200 MG/2ML IV SOLN
INTRAVENOUS | Status: DC | PRN
  Administered 2019-10-02: 200 mg via INTRAVENOUS

## 2019-10-02 MED ORDER — OXYCODONE HCL 5 MG/5ML PO SOLN: 5.0000 mg | Freq: Once | ORAL | Status: DC | PRN

## 2019-10-02 MED ORDER — SCOPOLAMINE 1 MG/3DAYS TD PT72
MEDICATED_PATCH | TRANSDERMAL | Status: AC
  Filled 2019-10-02: qty 1

## 2019-10-02 MED ORDER — ACETAMINOPHEN 160 MG/5ML PO SOLN: 325.0000 mg | ORAL | Status: DC | PRN

## 2019-10-02 MED ORDER — GLYCOPYRROLATE 0.2 MG/ML IJ SOLN
INTRAMUSCULAR | Status: DC | PRN
Start: 2019-10-02 — End: 2019-10-02
  Administered 2019-10-02: .2 mg via INTRAVENOUS

## 2019-10-02 MED ORDER — BUPIVACAINE LIPOSOME 1.3 % IJ SUSP
INTRAMUSCULAR | Status: DC | PRN
  Administered 2019-10-02: 20 mL

## 2019-10-02 MED ORDER — CHLORHEXIDINE GLUCONATE CLOTH 2 % EX PADS: 6.0000 | MEDICATED_PAD | Freq: Once | CUTANEOUS | Status: DC

## 2019-10-02 MED ORDER — CEFAZOLIN SODIUM-DEXTROSE 2-4 GM/100ML-% IV SOLN
INTRAVENOUS | Status: AC
  Filled 2019-10-02: qty 100

## 2019-10-02 MED ORDER — GABAPENTIN 300 MG PO CAPS
300.0000 mg | ORAL_CAPSULE | ORAL | Status: AC
  Administered 2019-10-02: 300 mg via ORAL

## 2019-10-02 MED ORDER — PROMETHAZINE HCL 25 MG/ML IJ SOLN: 6.2500 mg | INTRAMUSCULAR | Status: DC | PRN

## 2019-10-02 MED ORDER — ENSURE PRE-SURGERY PO LIQD: 296.0000 mL | Freq: Once | ORAL | Status: DC

## 2019-10-02 MED ORDER — FENTANYL CITRATE (PF) 100 MCG/2ML IJ SOLN
INTRAMUSCULAR | Status: DC | PRN
  Administered 2019-10-02 (×2): 100 ug via INTRAVENOUS
  Administered 2019-10-02: 50 ug via INTRAVENOUS

## 2019-10-02 MED ORDER — KETOROLAC TROMETHAMINE 15 MG/ML IJ SOLN
15.0000 mg | INTRAMUSCULAR | Status: AC
  Administered 2019-10-02: 15 mg via INTRAVENOUS

## 2019-10-02 MED ORDER — CEFAZOLIN SODIUM-DEXTROSE 2-4 GM/100ML-% IV SOLN
2.0000 g | INTRAVENOUS | Status: AC
  Administered 2019-10-02: 2 g via INTRAVENOUS

## 2019-10-02 MED ORDER — BUPIVACAINE HCL (PF) 0.5 % IJ SOLN
INTRAMUSCULAR | Status: AC
  Filled 2019-10-02: qty 30

## 2019-10-02 MED ORDER — BUPIVACAINE LIPOSOME 1.3 % IJ SUSP
INTRAMUSCULAR | Status: AC
  Filled 2019-10-02: qty 20

## 2019-10-02 MED ORDER — OXYCODONE HCL 5 MG PO TABS: 5.0000 mg | ORAL_TABLET | Freq: Once | ORAL | Status: DC | PRN

## 2019-10-02 MED ORDER — MEPERIDINE HCL 25 MG/ML IJ SOLN: 6.2500 mg | INTRAMUSCULAR | Status: DC | PRN

## 2019-10-02 MED ORDER — CEFAZOLIN SODIUM 1 G IJ SOLR
INTRAMUSCULAR | Status: AC
  Filled 2019-10-02: qty 20

## 2019-10-02 MED ORDER — ROCURONIUM BROMIDE 10 MG/ML (PF) SYRINGE
PREFILLED_SYRINGE | INTRAVENOUS | Status: DC | PRN
  Administered 2019-10-02: 10 mg via INTRAVENOUS
  Administered 2019-10-02: 60 mg via INTRAVENOUS

## 2019-10-02 MED ORDER — KETOROLAC TROMETHAMINE 15 MG/ML IJ SOLN: 15.0000 mg | Freq: Once | INTRAMUSCULAR | Status: DC

## 2019-10-02 SURGICAL SUPPLY — 28 items
BLADE CLIPPER SENSICLIP SURGIC (BLADE) ×3 IMPLANT
CABLE HIGH FREQUENCY MONO STRZ (ELECTRODE) ×3 IMPLANT
CHLORAPREP W/TINT 26 (MISCELLANEOUS) ×3 IMPLANT
DERMABOND ADVANCED (GAUZE/BANDAGES/DRESSINGS) ×2
DERMABOND ADVANCED .7 DNX12 (GAUZE/BANDAGES/DRESSINGS) ×1 IMPLANT
DEVICE SECURE STRAP 25 ABSORB (INSTRUMENTS) ×2 IMPLANT
DRSG COVADERM PLUS 2X2 (GAUZE/BANDAGES/DRESSINGS) ×4 IMPLANT
ELECT REM PT RETURN 9FT ADLT (ELECTROSURGICAL) ×3
ELECTRODE REM PT RTRN 9FT ADLT (ELECTROSURGICAL) ×1 IMPLANT
GLOVE BIOGEL PI IND STRL 7.0 (GLOVE) ×1 IMPLANT
GLOVE BIOGEL PI INDICATOR 7.0 (GLOVE) ×2
GLOVE SURG SS PI 7.0 STRL IVOR (GLOVE) ×3 IMPLANT
GOWN STRL REUS W/TWL LRG LVL3 (GOWN DISPOSABLE) ×3 IMPLANT
GRASPER SUT TROCAR 14GX15 (MISCELLANEOUS) ×2 IMPLANT
IRRIG SUCT STRYKERFLOW 2 WTIP (MISCELLANEOUS) ×3
IRRIGATION SUCT STRKRFLW 2 WTP (MISCELLANEOUS) ×1 IMPLANT
KIT TURNOVER CYSTO (KITS) ×3 IMPLANT
MESH 3DMAX 5X7 LT XLRG (Mesh General) ×2 IMPLANT
MESH 3DMAX 5X7 RT XLRG (Mesh General) ×2 IMPLANT
SET BASIN DAY SURGERY F.S. (CUSTOM PROCEDURE TRAY) ×3 IMPLANT
SET TUBE SMOKE EVAC HIGH FLOW (TUBING) ×3 IMPLANT
SHEARS HARMONIC ACE PLUS 36CM (ENDOMECHANICALS) ×2 IMPLANT
SUT MNCRL AB 4-0 PS2 18 (SUTURE) ×3 IMPLANT
SUT VICRYL 0 UR6 27IN ABS (SUTURE) ×2 IMPLANT
TOWEL OR 17X26 10 PK STRL BLUE (TOWEL DISPOSABLE) ×3 IMPLANT
TRAY LAPAROSCOPIC (CUSTOM PROCEDURE TRAY) ×3 IMPLANT
TROCAR BLADELESS OPT 12M 100M (ENDOMECHANICALS) ×3 IMPLANT
TROCAR BLADELESS OPT 5 100 (ENDOMECHANICALS) ×6 IMPLANT

## 2019-10-02 NOTE — Anesthesia Procedure Notes (Signed)
Procedure Name: Intubation Date/Time: 10/02/2019 11:45 AM Performed by: Riko Lumsden D, CRNA Pre-anesthesia Checklist: Patient identified, Emergency Drugs available, Suction available and Patient being monitored Patient Re-evaluated:Patient Re-evaluated prior to induction Oxygen Delivery Method: Circle system utilized Preoxygenation: Pre-oxygenation with 100% oxygen Induction Type: IV induction Ventilation: Mask ventilation without difficulty Laryngoscope Size: Mac and 4 Grade View: Grade I Tube type: Oral Tube size: 7.5 mm Number of attempts: 1 Airway Equipment and Method: Stylet Placement Confirmation: ETT inserted through vocal cords under direct vision,  positive ETCO2 and breath sounds checked- equal and bilateral Secured at: 22 cm Tube secured with: Tape Dental Injury: Teeth and Oropharynx as per pre-operative assessment

## 2019-10-02 NOTE — Discharge Instructions (Signed)
Laparoscopic Inguinal Hernia Repair, Adult, Care After This sheet gives you information about how to care for yourself after your procedure. Your health care provider may also give you more specific instructions. If you have problems or questions, contact your health care provider. What can I expect after the procedure? After the procedure, it is common to have:  Pain.  Swelling and bruising around the incision area.  Scrotal swelling, in men.  Some fluid or blood draining from your incisions. Follow these instructions at home: Incision care  Follow instructions from your health care provider about how to take care of your incisions. Make sure you: ? Wash your hands with soap and water before you change your bandage (dressing). If soap and water are not available, use hand sanitizer. ? Change your dressing as told by your health care provider. ? Leave stitches (sutures), skin glue, or adhesive strips in place. These skin closures may need to stay in place for 2 weeks or longer. If adhesive strip edges start to loosen and curl up, you may trim the loose edges. Do not remove adhesive strips completely unless your health care provider tells you to do that.  Check your incision area every day for signs of infection. Check for: ? More redness, swelling, or pain. ? More fluid or blood. ? Warmth. ? Pus or a bad smell.  Wear loose, soft clothing while your incisions heal. Driving  Do not drive or use heavy machinery while taking prescription pain medicine.  Do not drive for 24 hours if you were given a medicine to help you relax (sedative) during your procedure. Activity  Do not lift anything that is heavier than 10 lb (4.5 kg), or the limit that you are told, until your health care provider says that it is safe.  Ask your health care provider what activities are safe for you. A lot of activity during the first week after surgery can increase pain and swelling. For 1 week after your  procedure: ? Avoid activities that take a lot of effort, such as exercise or sports. ? You may walk and climb stairs as needed for daily activity, but avoid long walks or climbing stairs for exercise. Managing pain and swelling   Put ice on painful or swollen areas: ? Put ice in a plastic bag. ? Place a towel between your skin and the bag. ? Leave the ice on for 20 minutes, 2-3 times a day. General instructions  Do not take baths, swim, or use a hot tub until your health care provider approves. Ask your health care provider if you may take showers. You may only be allowed to take sponge baths.  Take over-the-counter and prescription medicines only as told by your health care provider.  To prevent or treat constipation while you are taking prescription pain medicine, your health care provider may recommend that you: ? Drink enough fluid to keep your urine pale yellow. ? Take over-the-counter or prescription medicines. ? Eat foods that are high in fiber, such as fresh fruits and vegetables, whole grains, and beans. ? Limit foods that are high in fat and processed sugars, such as fried and sweet foods.  Do not use any products that contain nicotine or tobacco, such as cigarettes and e-cigarettes. If you need help quitting, ask your health care provider.  Drink enough fluid to keep your urine pale yellow.  Keep all follow-up visits as told by your health care provider. This is important. Contact a health care provider if:  You have more redness, swelling, or pain around your incisions or your groin area.  You have more swelling in your scrotum.  You have more fluid or blood coming from your incisions.  Your incisions feel warm to the touch.  You have severe pain and medicines do not help.  You have abdominal pain or swelling.  You cannot eat or drink without vomiting.  You cannot urinate or pass a bowel movement.  You faint.  You feel dizzy.  You have nausea and  vomiting.  You have a fever. Get help right away if:  You have pus or a bad smell coming from your incisions.  You have redness, warmth, or pain in your leg.  You have chest pain.  You have problems breathing. Summary  Pain, swelling, and bruising are common after the procedure.  Check your incision area every day for signs of infection, such as more redness, swelling, or pain.  Put ice on painful or swollen areas for 20 minutes, 2-3 times a day. This information is not intended to replace advice given to you by your health care provider. Make sure you discuss any questions you have with your health care provider. Document Revised: 09/25/2018 Document Reviewed: 07/27/2016 Elsevier Patient Education  [place] Instructions  Activity: Get plenty of rest for the remainder of the day. A responsible individual must stay with you for 24 hours following the procedure.  For the next 24 hours, DO NOT: -Drive a car -Paediatric nurse -Drink alcoholic beverages -Take any medication unless instructed by your physician -Make any legal decisions or sign important papers.  Meals: Start with liquid foods such as gelatin or soup. Progress to regular foods as tolerated. Avoid greasy, spicy, heavy foods. If nausea and/or vomiting occur, drink only clear liquids until the nausea and/or vomiting subsides. Call your physician if vomiting continues.  Special Instructions/Symptoms: Your throat may feel dry or sore from the anesthesia or the breathing tube placed in your throat during surgery. If this causes discomfort, gargle with warm salt water. The discomfort should disappear within 24 hours.  Information for Discharge Teaching: EXPAREL (bupivacaine liposome injectable suspension)   Your surgeon or anesthesiologist gave you EXPAREL(bupivacaine) to help control your pain after surgery.   EXPAREL is a local anesthetic that provides pain relief by numbing  the tissue around the surgical site.  EXPAREL is designed to release pain medication over time and can control pain for up to 72 hours.  Depending on how you respond to EXPAREL, you may require less pain medication during your recovery.  Possible side effects:  Temporary loss of sensation or ability to move in the area where bupivacaine was injected.  Nausea, vomiting, constipation  Rarely, numbness and tingling in your mouth or lips, lightheadedness, or anxiety may occur.  Call your doctor right away if you think you may be experiencing any of these sensations, or if you have other questions regarding possible side effects.  Follow all other discharge instructions given to you by your surgeon or nurse. Eat a healthy diet and drink plenty of water or other fluids.  If you return to the hospital for any reason within 96 hours following the administration of EXPAREL, it is important for health care providers to know that you have received this anesthetic. A teal colored band has been placed on your arm with the date, time and amount of EXPAREL you have received in order to alert and inform your health care  providers. Please leave this armband in place for the full 96 hours following administration, and then you may remove the band.

## 2019-10-02 NOTE — Op Note (Signed)
Preop diagnosis: bilateral inguinal hernia  Postop diagnosis: bilateral indirect inguinal hernia  Procedure: laparoscopic Bilateral inguinal hernia repair with mesh  Surgeon: Gurney Maxin, M.D.  Asst: none  Anesthesia: Gen.   Indications for procedure: Shawn Newton is a 64 y.o. male with symptoms of pain and enlarging Bilateral inguinal hernia(s). After discussing risks, alternatives and benefits he decided on laparoscopic repair and was brought to day surgery for repair.  Description of procedure: The patient was brought into the operative suite, placed supine. Anesthesia was administered with endotracheal tube. Patient was strapped in place. The patient was prepped and draped in the usual sterile fashion.  Next Exparel:Marcaine mix was injected to the left of the umbilicus, and a transverse 2 cm incision was made. Dissection was used to visualize the anterior rectus sheath. Anterior rectus sheath was sharply incised, next to the 12 mm trocar was inserted into the preperitoneal space. Laparoscope was inserted and found that the peritoneum had a small hole. Therefore, decision was made to convert to a TAP type repair. 1 74mm trocar was placed in the left lower quadrant and 1 47mm trocar was placed in the right lower quadrant. Next the peritoneum was incised with harmonic scalpel and blunt dissection was used to create a peritoneal flap by freeing it away from the rectus muscles. Bilateral TAP blocks were placed with Marcaine/Exparel mix  On initial visualization , there were large indirect left and right hernias. Next I began our dissection on the left identifying the ASIS laterally and then working back medially removing the filmy tissue and adhesions of the peritoneum to the abdominal wall. Hernia sac was completely dissected out of the canal. Vas deference and contents of the cord were safely dissected away of the hernia sac.   Next I began dissection on the right identifying the ASIS  laterally and then working back medially removing the filmy tissue and adhesions of the peritoneum to the abdominal wall. Hernia sac was completely dissected out of the canal. Vas deference and contents of the cord were safely dissected away of the hernia sac.   A extra large right and left 3D max weight mesh were inserted and tacked medially to the lacunar ligament and anteriorly. The mesh was positioned flat and directly up against the direct and indirect areas. The peritoneal flaps were tacked back in place. A suture passer was used to close the fascia of the 12 mm trocar.  All skin incisions were closed with 4-0 monocryl subcu stitch. The patient awoke from anesthesia and was brought to PACU in stable condition.  Findings: bilateral large indirect inguinal hernia  Specimen: none  Blood loss: 25 ml  Local anesthesia: 40 ml Exparel:Marcaine mix  Complications: none  Implant: extra large right and left Bard 3D max mesh  Images:      Gurney Maxin, M.D. General, Bariatric, & Minimally Invasive Surgery [facility], [place] 12:56 PM 10/02/2019

## 2019-10-02 NOTE — H&P (Signed)
Shawn Newton is an 64 y.o. male.   Chief Complaint: hernia HPI: 64 yo male with bilateral symptomatic inguinal hernias. The hernias cause pain with activity. They do not cause nausea or vomiting.  Past Medical History:  Diagnosis Date  . Arthritis   . Chronic back pain   . Depression   . Full dentures   . History of colon polyps   . History of traumatic head injury    09-08-2006  FELL OF LADDER ,  LEFT FRONTAL SKULL FRACTURE AND CONCUSSION  . Inguinal hernia, bilateral   . Polysubstance abuse ([place])   . Sciatica    left  . Sickle cell trait (Shawn Newton)   . Strabismus    LEFT EYE    Past Surgical History:  Procedure Laterality Date  . CLOSED REDUCTION LEFT HIP DISLOCATION /  INSERTION TIBIAL TRACTION PIN AND ORIF RIGHT PATELLA FRACTURE  09-08-2006   @[facility]   . COLONOSCOPY  05/25/2011  . ORIF LEFT ACETABULAR HIP FRACTURE AND REMOVAL TIBIAL TRACTION PIN  09-11-2006   @[facility]   . TONSILLECTOMY  1970    Family History  Problem Relation Age of Onset  . Diabetes Father   . Diabetes Sister   . Colon cancer Neg Hx   . Heart disease Neg Hx   . Stomach cancer Neg Hx    Social History:  reports that he has been smoking cigars. He has been smoking about 0.00 packs per day. He has never used smokeless tobacco. He reports current alcohol use. He reports that he does not use drugs.  Allergies: No Known Allergies  Medications Prior to Admission  Medication Sig Dispense Refill  . cyclobenzaprine (FLEXERIL) 5 MG tablet Take 5 mg by mouth daily. And prn    . HYDROcodone-acetaminophen (NORCO/VICODIN) 5-325 MG tablet Take 1 tablet by mouth every 6 (six) hours as needed for moderate pain.    . naproxen (NAPROSYN) 500 MG tablet Take 500 mg by mouth 2 (two) times daily with a meal.      Results for orders placed or performed during the hospital encounter of 10/01/19 (from the past 48 hour(s))  SARS CORONAVIRUS 2 (TAT 6-24 HRS) Nasopharyngeal Nasopharyngeal Swab     Status: None   Collection Time:  10/01/19  9:32 AM   Specimen: Nasopharyngeal Swab  Result Value Ref Range   SARS Coronavirus 2 NEGATIVE NEGATIVE    Comment: (NOTE) SARS-CoV-2 target nucleic acids are NOT DETECTED. The SARS-CoV-2 RNA is generally detectable in upper and lower respiratory specimens during the acute phase of infection. Negative results do not preclude SARS-CoV-2 infection, do not rule out co-infections with other pathogens, and should not be used as the sole basis for treatment or other patient management decisions. Negative results must be combined with clinical observations, patient history, and epidemiological information. The expected result is Negative. Fact Sheet for Patients: SugarRoll.be Fact Sheet for Healthcare Providers: https://www.woods-mathews.com/ This test is not yet approved or cleared by the [place] FDA and  has been authorized for detection and/or diagnosis of SARS-CoV-2 by FDA under an Emergency Use Authorization (EUA). This EUA will remain  in effect (meaning this test can be used) for the duration of the COVID-19 declaration under Section 56 4(b)(1) of the Act, 21 U.S.C. section 360bbb-3(b)(1), unless the authorization is terminated or revoked sooner. Performed at [facility], [place] [address] [place], [place] [address]    No results found.  Review of Systems  Constitutional: Negative for chills and fever.  HENT: Negative for  hearing loss.   Respiratory: Negative for cough.   Cardiovascular: Negative for chest pain and palpitations.  Gastrointestinal: Negative for abdominal pain, nausea and vomiting.  Genitourinary: Negative for dysuria and urgency.  Musculoskeletal: Negative for myalgias and neck pain.  Skin: Negative for rash.  Neurological: Negative for dizziness and headaches.  Hematological: Does not bruise/bleed easily.  Psychiatric/Behavioral: Negative for suicidal ideas.    Blood pressure 128/85, pulse (!)  59, temperature (!) 97.1 F (36.2 C), temperature source Oral, resp. rate 16, height 5\' 10"  (1.778 m), weight 72.8 kg, SpO2 100 %. Physical Exam  Vitals reviewed. Constitutional: He is oriented to person, place, and time. He appears well-developed and well-nourished.  HENT:  Head: Normocephalic and atraumatic.  Eyes: Pupils are equal, round, and reactive to light. Conjunctivae and EOM are normal.  Cardiovascular: Normal rate and regular rhythm.  Respiratory: Effort normal and breath sounds normal.  GI: Soft. Bowel sounds are normal. He exhibits no distension. There is no abdominal tenderness.  Bilateral inguinal hernias  Musculoskeletal:        General: Normal range of motion.     Cervical back: Normal range of motion and neck supple.  Neurological: He is alert and oriented to person, place, and time.  Skin: Skin is warm and dry.  Psychiatric: He has a normal mood and affect. His behavior is normal.     Assessment/Plan 64 yo male with bilateral inguinal hernias -lap bilateral inguinal hernia repair with mesh -planned outpatient procedure  Shawn Skinner, MD 10/02/2019, 11:31 AM

## 2019-10-02 NOTE — Anesthesia Postprocedure Evaluation (Signed)
Anesthesia Post Note  Patient: Shawn Newton  Procedure(s) Performed: LAPAROSCOPIC BILATERAL INGUINAL HERNIA REPAIR WITH MESH (Bilateral Abdomen)     Patient location during evaluation: Phase II Anesthesia Type: General Level of consciousness: awake Pain management: pain level controlled Vital Signs Assessment: post-procedure vital signs reviewed and stable Respiratory status: spontaneous breathing Cardiovascular status: stable Postop Assessment: no apparent nausea or vomiting Anesthetic complications: no    Last Vitals:  Vitals:   10/02/19 1306 10/02/19 1316  BP: (!) 145/96 128/83  Pulse: 74 63  Resp: (!) 9 14  Temp: (!) 36.3 C   SpO2: 100% 99%    Last Pain:  Vitals:   10/02/19 1330  TempSrc:   PainSc: 0-No pain   Pain Goal: Patients Stated Pain Goal: 6 (10/02/19 0956)                 Huston Foley

## 2019-10-02 NOTE — Transfer of Care (Signed)
Immediate Anesthesia Transfer of Care Note  Patient: Shawn Newton  Procedure(s) Performed: LAPAROSCOPIC BILATERAL INGUINAL HERNIA REPAIR WITH MESH (Bilateral Abdomen)  Patient Location: PACU  Anesthesia Type:General  Level of Consciousness: awake, alert  and oriented  Airway & Oxygen Therapy: Patient Spontanous Breathing and Patient connected to nasal cannula oxygen  Post-op Assessment: Report given to RN and Post -op Vital signs reviewed and stable  Post vital signs: Reviewed and stable  Last Vitals:  Vitals Value Taken Time  BP 145/96 10/02/19 1306  Temp    Pulse 75 10/02/19 1308  Resp 19 10/02/19 1308  SpO2 100 % 10/02/19 1308  Vitals shown include unvalidated device data.  Last Pain:  Vitals:   10/02/19 0956  TempSrc: Oral  PainSc: 0-No pain      Patients Stated Pain Goal: 6 ([id number])  Complications: No apparent anesthesia complications

## 2019-10-02 NOTE — Anesthesia Preprocedure Evaluation (Signed)
Anesthesia Evaluation  Patient identified by MRN, date of birth, ID band Patient awake    Reviewed: Allergy & Precautions, NPO status , Patient's Chart, lab work & pertinent test results  Airway Mallampati: I       Dental  (+) Edentulous Upper, Edentulous Lower   Pulmonary neg pulmonary ROS, Current Smoker and Patient abstained from smoking.,    Pulmonary exam normal        Cardiovascular negative cardio ROS Normal cardiovascular exam Rhythm:Regular Rate:Normal     Neuro/Psych Depression    GI/Hepatic negative GI ROS, (+)     substance abuse  ,   Endo/Other  negative endocrine ROS  Renal/GU negative Renal ROS  negative genitourinary   Musculoskeletal  (+) Arthritis ,   Abdominal Normal abdominal exam  (+)   Peds  Hematology negative hematology ROS (+)   Anesthesia Other Findings   Reproductive/Obstetrics                             Anesthesia Physical Anesthesia Plan  ASA: II  Anesthesia Plan: General   Post-op Pain Management:    Induction: Intravenous  PONV Risk Score and Plan: 3 and Ondansetron, Dexamethasone and Midazolam  Airway Management Planned: Oral ETT  Additional Equipment: None  Intra-op Plan:   Post-operative Plan: Extubation in OR  Informed Consent: I have reviewed the patients History and Physical, chart, labs and discussed the procedure including the risks, benefits and alternatives for the proposed anesthesia with the patient or authorized representative who has indicated his/her understanding and acceptance.     Dental advisory given  Plan Discussed with: CRNA  Anesthesia Plan Comments:         Anesthesia Quick Evaluation

## 2020-02-03 ENCOUNTER — Ambulatory Visit: Payer: Medicare Other | Attending: Internal Medicine

## 2020-02-03 DIAGNOSIS — Z23 Encounter for immunization: Secondary | ICD-10-CM

## 2020-02-03 NOTE — Progress Notes (Signed)
° °  Covid-19 Vaccination Clinic  Name:  Shawn Newton    MRN: [id number] DOB: 08-22-1955  02/03/2020  [name] was observed post Covid-19 immunization for 15 minutes without incident. He was provided with Vaccine Information Sheet and instruction to access the V-Safe system.   [name] was instructed to call 911 with any severe reactions post vaccine:  Difficulty breathing   Swelling of face and throat   A fast heartbeat   A bad rash all over body   Dizziness and weakness   Immunizations Administered    Name Date Dose VIS Date Route   Pfizer COVID-19 Vaccine 02/03/2020 10:41 AM 0.3 mL 06/25/2018 Intramuscular   Manufacturer: [place]   Lot: [place]: [id number]

## 2020-05-14 ENCOUNTER — Ambulatory Visit: Payer: Medicare Other | Admitting: Family Medicine

## 2021-07-18 ENCOUNTER — Encounter: Payer: Self-pay | Admitting: Internal Medicine

## 2021-07-22 ENCOUNTER — Encounter: Payer: Self-pay | Admitting: Internal Medicine

## 2021-08-03 ENCOUNTER — Telehealth: Payer: Self-pay | Admitting: *Deleted

## 2021-08-03 NOTE — Telephone Encounter (Signed)
Pt called multiple times on both home and cell phone numbers. No voicemail on either number so unable to leave message to call back to reschedule PV.  Will reschedule visit if pt calls back by 5pm, otherwise will cancel colon. ?

## 2021-08-03 NOTE — Telephone Encounter (Signed)
No return call from pt to reschedule, PV and colon cancelled. ?

## 2021-09-08 ENCOUNTER — Encounter: Payer: Medicare Other | Admitting: Internal Medicine

## 2024-02-27 ENCOUNTER — Encounter (HOSPITAL_COMMUNITY): Payer: Self-pay | Admitting: Emergency Medicine

## 2024-02-27 ENCOUNTER — Emergency Department (HOSPITAL_COMMUNITY): Admission: EM | Admit: 2024-02-27 | Discharge: 2024-02-27 | Disposition: A | Discharge status: Home / Self Care

## 2024-02-27 ENCOUNTER — Other Ambulatory Visit: Payer: Self-pay

## 2024-02-27 DIAGNOSIS — H9201 Otalgia, right ear: Secondary | ICD-10-CM | POA: Diagnosis present

## 2024-02-27 NOTE — ED Provider Notes (Signed)
 [place] EMERGENCY DEPARTMENT AT [facility] Provider Note   CSN: [id number] Arrival date & time: 02/27/24  1115     Patient presents with: No chief complaint on file.   Shawn Newton is a 68 y.o. male.   68 year old male presents for evaluation of right ear pain.  States that he has a fluttering sensation in his right ear for the last few days.  States last time this happened it was because he stuck his head underwater.  He denies any other symptoms or concerns.        Prior to Admission medications   Medication Sig Start Date End Date Taking? Authorizing Provider  cyclobenzaprine  (FLEXERIL ) 5 MG tablet Take 5 mg by mouth daily. And prn    [provider]  ibuprofen  (ADVIL ) 800 MG tablet Take 1 tablet (800 mg total) by mouth every 8 (eight) hours as needed. 10/02/19   Kinsinger, Herlene Righter, MD  oxyCODONE  (OXY IR/ROXICODONE ) 5 MG immediate release tablet Take 1 tablet (5 mg total) by mouth every 6 (six) hours as needed for severe pain. 10/02/19   Kinsinger, Herlene Righter, MD    Allergies: Patient has no known allergies.    Review of Systems  Constitutional:  Negative for chills and fever.  HENT:  Positive for ear pain. Negative for sore throat.   Eyes:  Negative for pain and visual disturbance.  Respiratory:  Negative for cough and shortness of breath.   Cardiovascular:  Negative for chest pain and palpitations.  Gastrointestinal:  Negative for abdominal pain and vomiting.  Genitourinary:  Negative for dysuria and hematuria.  Musculoskeletal:  Negative for arthralgias and back pain.  Skin:  Negative for color change and rash.  Neurological:  Negative for seizures and syncope.  All other systems reviewed and are negative.   Updated Vital Signs BP 131/87 (BP Location: Left Arm)   Pulse 81   Temp 98.1 F (36.7 C)   Resp 19   SpO2 100%   Physical Exam Vitals and nursing note reviewed.  Constitutional:      General: He is not in acute distress.     Appearance: Normal appearance. He is well-developed. He is not ill-appearing.  HENT:     Head: Normocephalic and atraumatic.     Right Ear: Tympanic membrane, ear canal and external ear normal.     Left Ear: Tympanic membrane, ear canal and external ear normal.     Ears:     Comments: Right TM with mild amount of fluid behind it but no evidence of otitis media Eyes:     Conjunctiva/sclera: Conjunctivae normal.  Cardiovascular:     Rate and Rhythm: Normal rate and regular rhythm.     Heart sounds: No murmur heard. Pulmonary:     Effort: Pulmonary effort is normal. No respiratory distress.     Breath sounds: Normal breath sounds.  Abdominal:     Palpations: Abdomen is soft.     Tenderness: There is no abdominal tenderness.  Musculoskeletal:        General: No swelling.     Cervical back: Neck supple.  Skin:    General: Skin is warm and dry.     Capillary Refill: Capillary refill takes less than 2 seconds.  Neurological:     General: No focal deficit present.     Mental Status: He is alert.  Psychiatric:        Mood and Affect: Mood normal.     (all labs ordered are listed,  but only abnormal results are displayed) Labs Reviewed - No data to display  EKG: None  Radiology: No results found.   Procedures   Medications Ordered in the ED - No data to display                                  Medical Decision Making Patient with fluid behind his TM likely causing some ear discomfort.  Advised Tylenol  Motrin  as needed for pain and follow-up with primary care as needed.  I told him that his symptoms will likely resolve on their own.  He feels comfortable to plan be discharged home.  Advised return for any new or worsening symptoms.  Problems Addressed: Right ear pain: acute illness or injury  Amount and/or Complexity of Data Reviewed External Data Reviewed: notes.    Details: Prior ED records reviewed and patient seen in the past for weakness with negative  workup  Risk OTC drugs. Prescription drug management.     Final diagnoses:  Right ear pain    ED Discharge Orders     None          Gennaro Duwaine CROME, DO 02/27/24 1249

## 2024-02-27 NOTE — ED Triage Notes (Signed)
 Pt here for a fluttering sound in the ear when laying down.

## 2024-02-27 NOTE — Discharge Instructions (Addendum)
 There is fluid behind your eardrum on the right.  This will resolve on its own.  Please do not stick any foreign bodies or Q-tips into the ear.  You can use Tylenol  and Motrin  as needed for pain and discomfort.
